# Patient Record
Sex: Male | Born: 1935 | Race: White | Hispanic: No | Marital: Married | Smoking: Former smoker
Health system: Southern US, Community
[De-identification: ages and names within clinical notes are randomized; demographics above are authoritative.]

## PROBLEM LIST (undated history)

## (undated) DIAGNOSIS — J069 Acute upper respiratory infection, unspecified: Secondary | ICD-10-CM

## (undated) DIAGNOSIS — K5792 Diverticulitis of intestine, part unspecified, without perforation or abscess without bleeding: Secondary | ICD-10-CM

## (undated) DIAGNOSIS — E079 Disorder of thyroid, unspecified: Secondary | ICD-10-CM

## (undated) DIAGNOSIS — K5730 Diverticulosis of large intestine without perforation or abscess without bleeding: Secondary | ICD-10-CM

## (undated) DIAGNOSIS — E039 Hypothyroidism, unspecified: Secondary | ICD-10-CM

## (undated) DIAGNOSIS — D3A098 Benign carcinoid tumors of other sites: Secondary | ICD-10-CM

## (undated) DIAGNOSIS — E349 Endocrine disorder, unspecified: Secondary | ICD-10-CM

## (undated) DIAGNOSIS — C801 Malignant (primary) neoplasm, unspecified: Secondary | ICD-10-CM

## (undated) DIAGNOSIS — N529 Male erectile dysfunction, unspecified: Secondary | ICD-10-CM

## (undated) DIAGNOSIS — I341 Nonrheumatic mitral (valve) prolapse: Secondary | ICD-10-CM

## (undated) DIAGNOSIS — R140 Abdominal distension (gaseous): Secondary | ICD-10-CM

## (undated) DIAGNOSIS — E669 Obesity, unspecified: Secondary | ICD-10-CM

## (undated) DIAGNOSIS — N401 Enlarged prostate with lower urinary tract symptoms: Secondary | ICD-10-CM

## (undated) DIAGNOSIS — Z5189 Encounter for other specified aftercare: Secondary | ICD-10-CM

## (undated) DIAGNOSIS — R011 Cardiac murmur, unspecified: Secondary | ICD-10-CM

## (undated) DIAGNOSIS — J189 Pneumonia, unspecified organism: Secondary | ICD-10-CM

## (undated) DIAGNOSIS — I2510 Atherosclerotic heart disease of native coronary artery without angina pectoris: Secondary | ICD-10-CM

## (undated) DIAGNOSIS — I499 Cardiac arrhythmia, unspecified: Secondary | ICD-10-CM

## (undated) DIAGNOSIS — Z8719 Personal history of other diseases of the digestive system: Secondary | ICD-10-CM

## (undated) DIAGNOSIS — I10 Essential (primary) hypertension: Secondary | ICD-10-CM

## (undated) HISTORY — PX: NO PAST SURGERIES: SHX2092

## (undated) HISTORY — DX: Abdominal distension (gaseous): R14.0

## (undated) HISTORY — PX: EYE SURGERY: SHX253

## (undated) HISTORY — DX: Diverticulosis of large intestine without perforation or abscess without bleeding: K57.30

## (undated) HISTORY — DX: Nonrheumatic mitral (valve) prolapse: I34.1

## (undated) HISTORY — DX: Essential (primary) hypertension: I10

## (undated) HISTORY — DX: Obesity, unspecified: E66.9

## (undated) HISTORY — PX: MOUTH SURGERY: SHX715

## (undated) HISTORY — DX: Male erectile dysfunction, unspecified: N52.9

## (undated) HISTORY — DX: Personal history of other diseases of the digestive system: Z87.19

## (undated) HISTORY — DX: Disorder of thyroid, unspecified: E07.9

## (undated) HISTORY — DX: Diverticulitis of intestine, part unspecified, without perforation or abscess without bleeding: K57.92

## (undated) HISTORY — PX: OTHER SURGICAL HISTORY: SHX169

## (undated) HISTORY — DX: Endocrine disorder, unspecified: E34.9

## (undated) HISTORY — DX: Atherosclerotic heart disease of native coronary artery without angina pectoris: I25.10

---

## 1990-11-23 DIAGNOSIS — Z5189 Encounter for other specified aftercare: Secondary | ICD-10-CM

## 1990-11-23 HISTORY — DX: Encounter for other specified aftercare: Z51.89

## 2010-02-21 DEATH — deceased

## 2011-12-02 ENCOUNTER — Other Ambulatory Visit: Payer: Self-pay | Admitting: Family Medicine

## 2011-12-02 DIAGNOSIS — N6314 Unspecified lump in the right breast, lower inner quadrant: Secondary | ICD-10-CM

## 2011-12-11 ENCOUNTER — Ambulatory Visit
Admission: RE | Admit: 2011-12-11 | Discharge: 2011-12-11 | Disposition: A | Discharge status: Home / Self Care | Payer: Medicare HMO | Source: Ambulatory Visit | Attending: Family Medicine | Admitting: Family Medicine

## 2011-12-11 ENCOUNTER — Other Ambulatory Visit: Payer: Self-pay | Admitting: Family Medicine

## 2011-12-11 DIAGNOSIS — N6314 Unspecified lump in the right breast, lower inner quadrant: Secondary | ICD-10-CM

## 2011-12-11 DIAGNOSIS — N6310 Unspecified lump in the right breast, unspecified quadrant: Secondary | ICD-10-CM

## 2011-12-22 ENCOUNTER — Other Ambulatory Visit: Payer: Self-pay | Admitting: Family Medicine

## 2011-12-22 ENCOUNTER — Ambulatory Visit
Admission: RE | Admit: 2011-12-22 | Discharge: 2011-12-22 | Disposition: A | Discharge status: Home / Self Care | Payer: Medicare HMO | Source: Ambulatory Visit | Attending: Family Medicine | Admitting: Family Medicine

## 2011-12-22 DIAGNOSIS — R109 Unspecified abdominal pain: Secondary | ICD-10-CM

## 2011-12-23 ENCOUNTER — Ambulatory Visit
Admission: RE | Admit: 2011-12-23 | Discharge: 2011-12-23 | Disposition: A | Discharge status: Home / Self Care | Payer: Medicare HMO | Source: Ambulatory Visit | Attending: Family Medicine | Admitting: Family Medicine

## 2011-12-23 MED ORDER — IOHEXOL 300 MG/ML  SOLN
100.0000 mL | Freq: Once | INTRAMUSCULAR | Status: AC | PRN
  Administered 2011-12-23: 100 mL via INTRAVENOUS

## 2011-12-31 ENCOUNTER — Encounter (INDEPENDENT_AMBULATORY_CARE_PROVIDER_SITE_OTHER): Payer: Self-pay | Admitting: General Surgery

## 2012-01-07 ENCOUNTER — Encounter: Payer: Self-pay | Admitting: Internal Medicine

## 2012-01-07 ENCOUNTER — Ambulatory Visit (INDEPENDENT_AMBULATORY_CARE_PROVIDER_SITE_OTHER): Payer: Medicare HMO | Admitting: General Surgery

## 2012-01-07 ENCOUNTER — Encounter (INDEPENDENT_AMBULATORY_CARE_PROVIDER_SITE_OTHER): Payer: Self-pay | Admitting: General Surgery

## 2012-01-07 VITALS — BP 136/74 | HR 70 | Temp 97.6°F | Resp 16 | Ht 69.0 in | Wt 170.4 lb

## 2012-01-07 DIAGNOSIS — I341 Nonrheumatic mitral (valve) prolapse: Secondary | ICD-10-CM | POA: Insufficient documentation

## 2012-01-07 DIAGNOSIS — R1903 Right lower quadrant abdominal swelling, mass and lump: Secondary | ICD-10-CM

## 2012-01-07 DIAGNOSIS — D3A00 Benign carcinoid tumor of unspecified site: Secondary | ICD-10-CM

## 2012-01-07 DIAGNOSIS — I2510 Atherosclerotic heart disease of native coronary artery without angina pectoris: Secondary | ICD-10-CM | POA: Insufficient documentation

## 2012-01-07 DIAGNOSIS — I10 Essential (primary) hypertension: Secondary | ICD-10-CM | POA: Insufficient documentation

## 2012-01-07 DIAGNOSIS — I059 Rheumatic mitral valve disease, unspecified: Secondary | ICD-10-CM

## 2012-01-07 NOTE — Patient Instructions (Signed)
You have calcified lymph nodes in your right lower abdomen. I suspect that this is causing your intermittent episodes of pain and vomiting. I do not know whether there is a malignancy or not.  He will be referred immediately for colonoscopy.  We will be obtaining urine and blood tests  You will have a nuclear medicine scan to see if there is a carcinoid tumor somewhere.  You will return to see me in 2 weeks, and probably we will need to schedule you for an abdominal operation. We discussed all this in detail today.

## 2012-01-07 NOTE — Progress Notes (Signed)
Patient ID: Matthew Brennan, male   DOB: [birth year], 78 y.o.   MRN: [id number]  Chief Complaint  Patient presents with  . New Evaluation    Recurrent small bowel obstruction    HPI Matthew Brennan is a 78 y.o. male.  He is referred to me by Dr. Juliene Pina for evaluation of abdominal pain, vomiting, and a calcified nodal mass in the right lower quadrant mesentery.  The patient used to live in [place] near [place] but was remarried and moved to [place] in August of 2012. In December 2011, while in [place] he was admitted to the hospital for 24 hours with abdominal pain. He thought he had a bowel obstruction. He was told he had diverticulitis. Was discharged home. There was no followup according to him.  Over the past year he states that he gets "blocked" which is manifested by abdominal pain nausea and vomiting. This lasts 24-48 hours. He gets distended. This resolves. This occurs about once every 3 months. His last colonoscopy was 6 years ago. He states that he has lost 10 pounds in the past 2 months. He does not have diarrhea or flushing or diaphoresis. He mostly has hard stools but does have regular stools. He's been eating more liquid, more softer foods lately.  He is asymptomatic today. He has not had any prior abdominal surgery. There've been no fevers.  Recent CT scan shows calcified mesenteric nodal mass measuring 3-1/2 cm in the right lower quadrant. Differential considerations include adenocarcinoma, carcinoid or old granulomatous disease. No small bowel tumor is identified. Also noted was a large, fat-containing right inguinal hernia. There were tiny pulmonary nodules the lung bases. Liver looked normal. HPI  Past Medical History  Diagnosis Date  . Hypertension   . Mitral valve prolapse   . Thyroid disease     hypothyroidism  . Obesity   . Testosterone deficiency   . ED (erectile dysfunction)   . Diverticulitis   . Hx SBO   . CAD (coronary artery disease)   . Abdominal  distension   . Abdominal pain     History reviewed. No pertinent past surgical history.  Family History  Problem Relation Age of Onset  . Kidney disease Father     Social History History  Substance Use Topics  . Smoking status: Former Smoker    Quit date: 01/06/1962  . Smokeless tobacco: Never Used  . Alcohol Use: No    Allergies  Allergen Reactions  . Ciprofloxacin Rash    All over the body  . Synthroid Rash    All over the body    Current Outpatient Prescriptions  Medication Sig Dispense Refill  . Ascorbic Acid (VITAMIN C) 1000 MG tablet Take 1,000 mg by mouth daily.      Marland Kitchen testosterone (ANDROGEL) 50 MG/5GM GEL Place 5 g onto the skin daily.      . valsartan-hydrochlorothiazide (DIOVAN-HCT) 320-25 MG per tablet Take 1 tablet by mouth daily.      . vitamin E 400 UNIT capsule Take 800 Units by mouth daily.        Review of Systems Review of Systems  Constitutional: Positive for unexpected weight change. Negative for fever and chills.  HENT: Negative for hearing loss, congestion, sore throat, trouble swallowing and voice change.   Eyes: Negative for visual disturbance.  Respiratory: Negative for cough and wheezing.   Cardiovascular: Negative for chest pain, palpitations and leg swelling.  Gastrointestinal: Positive for nausea, vomiting, abdominal pain and abdominal distention. Negative for  diarrhea, constipation, blood in stool, anal bleeding and rectal pain.  Genitourinary: Negative for hematuria and difficulty urinating.  Musculoskeletal: Negative for arthralgias.  Skin: Negative for rash and wound.  Neurological: Negative for seizures, syncope, weakness and headaches.  Hematological: Negative for adenopathy. Does not bruise/bleed easily.  Psychiatric/Behavioral: Negative for confusion.    Blood pressure 136/74, pulse 70, temperature 97.6 F (36.4 C), temperature source Temporal, resp. rate 16, height 5\' 9"  (1.753 m), weight 170 lb 6.4 oz (77.293 kg).  Physical  Exam Physical Exam  Constitutional: He is oriented to person, place, and time. He appears well-developed and well-nourished. No distress.  HENT:  Head: Normocephalic.  Nose: Nose normal.  Mouth/Throat: No oropharyngeal exudate.  Eyes: Conjunctivae and EOM are normal. Pupils are equal, round, and reactive to light. Right eye exhibits no discharge. Left eye exhibits no discharge. No scleral icterus.  Neck: Normal range of motion. Neck supple. No JVD present. No tracheal deviation present. No thyromegaly present.  Cardiovascular: Normal rate, regular rhythm and intact distal pulses.   Murmur heard.      Systolic murmur  Pulmonary/Chest: Effort normal and breath sounds normal. No stridor. No respiratory distress. He has no wheezes. He has no rales. He exhibits no tenderness.       8 mm subcutaneous lipoma right medial pectoral area.  Abdominal: Soft. Bowel sounds are normal. He exhibits no distension and no mass. There is no tenderness. There is no rebound and no guarding.       Abdomen is soft. Objectively not tender, but subjectively he does feel some pain in the right infraumbilical area. I do not feel a mass or hernia. Liver is not enlarged. Not distended.  Musculoskeletal: Normal range of motion. He exhibits no edema and no tenderness.  Lymphadenopathy:    He has no cervical adenopathy.  Neurological: He is alert and oriented to person, place, and time. He has normal reflexes. Coordination normal.  Skin: Skin is warm and dry. No rash noted. He is not diaphoretic. No erythema. No pallor.  Psychiatric: He has a normal mood and affect. His behavior is normal. Judgment and thought content normal.    Data Reviewed I reviewed the CT scan, and CT scan report, and the notes from [facility].  Assessment    Abdominal pain, intermittent vomiting, calcified nodal mass right lower quadrant. Etiology unclear. Agree the considerations are granulomatous disease, adenocarcinoma, carcinoid tumor.  Historically it sounds like he may have intermittent obstruction.  Hypertension  Mitral valve prolapse.      Plan     I suspect that he will need an abdominal operation to sort this out.  Since he is asymptomatic at this time, we're going to  pursue a preop workup. He will be referred to gastroenterology for GI consultation and colonoscopy. We will get a nuclear medicine octreotide scan. We'll get a 24-hour urine for HIAA and its we will get blood for CEA and chromogranin A.  Return to see me in 2 weeks after all this is done and we'll decide where to go with the surgical intervention       Angelia Mould. Derrell Lolling, M.D., [facility] General and Minimally invasive Surgery Breast and Colorectal Surgery Office:   [phone] Pager:   [phone]  01/07/2012, 10:26 AM

## 2012-01-08 ENCOUNTER — Telehealth: Payer: Self-pay | Admitting: *Deleted

## 2012-01-08 LAB — CEA: CEA: <0.5 ng/mL (ref 0.0–5.0)

## 2012-01-08 NOTE — Telephone Encounter (Signed)
Dr Rhea Belton:  Pt is scheduled for colonoscopy on 2/25 for tumor marking for carcinoid tumor of abdomen.  I do not see where you have seen this pt before.  Please see Office visit with Dr. Claud Kelp on 2/14.  Are you okay with this patient being direct colonoscopy or would you like to see him in office before procedure? Thanks, Ezra Sites

## 2012-01-11 ENCOUNTER — Ambulatory Visit (AMBULATORY_SURGERY_CENTER): Payer: Medicare HMO | Admitting: *Deleted

## 2012-01-11 ENCOUNTER — Encounter: Payer: Self-pay | Admitting: Internal Medicine

## 2012-01-11 VITALS — Ht 69.0 in | Wt 175.1 lb

## 2012-01-11 DIAGNOSIS — R1903 Right lower quadrant abdominal swelling, mass and lump: Secondary | ICD-10-CM

## 2012-01-11 DIAGNOSIS — D3A00 Benign carcinoid tumor of unspecified site: Secondary | ICD-10-CM

## 2012-01-11 MED ORDER — PEG-KCL-NACL-NASULF-NA ASC-C 100 G PO SOLR: ORAL | Status: DC

## 2012-01-11 NOTE — Telephone Encounter (Signed)
lmom for pt to call  °

## 2012-01-11 NOTE — Telephone Encounter (Signed)
Ezra Sites, RN in [facility] called back to report pt is coming in today. Since I can't get in touch with him, I gave the appt to [facility]; pt may change if he needs to.

## 2012-01-11 NOTE — Telephone Encounter (Signed)
Office visit 1st please.

## 2012-01-12 ENCOUNTER — Encounter: Payer: Self-pay | Admitting: Internal Medicine

## 2012-01-12 ENCOUNTER — Ambulatory Visit (HOSPITAL_COMMUNITY): Payer: Medicare HMO

## 2012-01-12 LAB — CHROMOGRANIN A: Chromogranin A: 54 ng/mL — ABNORMAL HIGH (ref 1.9–15.0)

## 2012-01-13 ENCOUNTER — Ambulatory Visit (INDEPENDENT_AMBULATORY_CARE_PROVIDER_SITE_OTHER): Payer: Medicare HMO | Admitting: Internal Medicine

## 2012-01-13 ENCOUNTER — Ambulatory Visit (HOSPITAL_COMMUNITY): Payer: Medicare HMO

## 2012-01-13 ENCOUNTER — Encounter: Payer: Self-pay | Admitting: Internal Medicine

## 2012-01-13 VITALS — BP 130/82 | HR 66 | Ht 69.0 in | Wt 170.0 lb

## 2012-01-13 DIAGNOSIS — R1031 Right lower quadrant pain: Secondary | ICD-10-CM

## 2012-01-13 DIAGNOSIS — Z8719 Personal history of other diseases of the digestive system: Secondary | ICD-10-CM

## 2012-01-13 DIAGNOSIS — R933 Abnormal findings on diagnostic imaging of other parts of digestive tract: Secondary | ICD-10-CM

## 2012-01-13 DIAGNOSIS — R1903 Right lower quadrant abdominal swelling, mass and lump: Secondary | ICD-10-CM

## 2012-01-13 NOTE — Progress Notes (Signed)
Subjective:    Patient ID: Matthew Brennan, male    DOB: 1932-04-17, 78 y.o.   MRN: [id number]  HPI Matthew Brennan is a 78 yo male with PMH of hypertension, mitral valve prolapse, hypothyroidism, CAD, and recurrent small bowel obstruction who is seen in consultation from Dr. Derrell Lolling for evaluation of right lower quadrant pain, abnormal GI tract imaging and history of bowel obstruction. The patient had a recent CT scan which showed a calcified nodal mass in the right lower quadrant mesentery. He recently moved from [place] to [place] in August 2012, but prior to moving he was treated for recurrent small bowel obstructions, once requiring hospitalization. There was no obvious etiology for a small bowel obstruction, and he was told it was even diverticulitis. He reports over the past several years having episodes of right lower corner pain along with bloating, which he associates with transient obstruction. During these periods he would reduce his diet to liquids and "take it easy", which usually was enough for these episodes to pass. He reports the last such episode was one month ago and again he reduced his diet to soft foods and liquids. The pain and distention resolved.  Currently he reports no pain, but tenderness in the right lower quadrant to palpation only. He does report a good appetite, but he is afraid it more than a soft and liquid diet. He does report an approximate 10-12 pound weight loss over the last month. His bowel movements have been regular, formed and brown, occurring once daily. He denies blood or melena.  No current nausea or vomiting. No bad heartburn. No dysphagia or odynophagia. No fevers or chills.  Review of Systems As per history of present illness, otherwise negative  Patient Active Problem List  Diagnoses  . Hypertension  . Mitral valve prolapse  . CAD (coronary artery disease)  . Abdominal mass, RLQ (right lower quadrant)   Past Surgical History  Procedure Date  . No  prior surgeries    Current Outpatient Prescriptions  Medication Sig Dispense Refill  . Ascorbic Acid (VITAMIN C) 1000 MG tablet Take 1,000 mg by mouth daily.      Marland Kitchen testosterone (ANDROGEL) 50 MG/5GM GEL Place 5 g onto the skin daily.      Marland Kitchen UNABLE TO FIND Med Name: ETDA.  Takes 18 drops daily      . valsartan-hydrochlorothiazide (DIOVAN-HCT) 320-25 MG per tablet Take 1 tablet by mouth daily.      Marland Kitchen VITAMIN D, ERGOCALCIFEROL, PO Take 800 Units by mouth daily.      . vitamin E 400 UNIT capsule Take 800 Units by mouth daily.      . peg 3350 powder (MOVIPREP) 100 G SOLR moviprep as directed  1 kit  0   Allergies  Allergen Reactions  . Ciprofloxacin Rash    All over the body  . Synthroid Rash    All over the body   Family History  Problem Relation Age of Onset  . Kidney disease Father   . Colon cancer Neg Hx   . Stomach cancer Neg Hx    History   Social History  . Marital Status: Married    Spouse Name: N/A    Number of Children: N/A  . Years of Education: N/A   Social History Main Topics  . Smoking status: Former Smoker    Quit date: 01/06/1962  . Smokeless tobacco: Never Used  . Alcohol Use: No  . Drug Use: No      Objective:  Physical Exam BP 130/82  Pulse 66  Ht 5\' 9"  (1.753 m)  Wt 170 lb (77.111 kg)  BMI 25.10 kg/m2 Constitutional: Well-developed and well-nourished. No distress. HEENT: Normocephalic and atraumatic. Oropharynx is clear and moist. No oropharyngeal exudate. Conjunctivae are normal. Pupils are equal round and reactive to light. No scleral icterus. Neck: Neck supple. Trachea midline. Cardiovascular: Normal rate, regular rhythm and intact distal pulses. 2/6 SEM Pulmonary/chest: Effort normal and breath sounds normal. No wheezing, rales or rhonchi. Abdominal: Soft, very mild tenderness in the right midabdomen lateral to the umbilicus without rebound or guarding, nondistended. Bowel sounds active throughout. There are no masses palpable. No  hepatosplenomegaly. Extremities: no clubbing, cyanosis, or edema Lymphadenopathy: No cervical adenopathy noted. Neurological: Alert and oriented to person place and time. Skin: Skin is warm and dry. No rashes noted. Psychiatric: Normal mood and affect. Behavior is normal.  Imaging reviewed CT ABDOMEN AND PELVIS WITH CONTRAST 12/22/11   Technique:  Multidetector CT imaging of the abdomen and pelvis was performed following the standard protocol during bolus administration of intravenous contrast.   Contrast: OMNIPAQUE IOHEXOL 300 MG/ML IV SOLN   BUN and creatinine were obtained on site at [facility] at [address] Results:  BUN 23 mg/dL,  Creatinine 1.1 mg/dL.   Comparison: Acute abdominal series 12/22/2011   Findings: Tiny sub 4 mm ground-glass attenuation pulmonary nodule is present adjacent to the left major fissure in the left lower lobe.  Scattered other smaller pulmonary nodules are identified. No further follow-up is recommended in a patient without history malignancy. Coronary artery atherosclerosis is present. If office based assessment of coronary risk factors has not been performed, it is now recommended.  Abdominal aortic atherosclerosis.  No aneurysm.  Vasectomy clips are present in the inguinal canals. There is a large fat containing right inguinal hernia.   The liver appears within normal limits.  Spleen normal. Gallbladder normal.  No calcified gallstones.  Common bile duct is within normal limits.  Pancreas normal.  Adrenal glands normal. Bilateral renal cysts are present which appears simple.  Stomach appears normal.  Duodenum is within normal limits.   There is no small bowel obstruction.  Distended loops of small bowel are present in the right lower quadrant with fluid levels. There is some tethering of small bowel loops and a cluster of mesenteric nodules in the left lower quadrant with central calcification.  In aggregate, these  measure 36 mm x 19 mm (image 44 series 2). Colon shows severe diverticulosis without diverticulitis.  Urinary bladder appears normal.  Prostatomegaly. Large calcification in the left inguinal canal probably is a phlebolith.   No aggressive osseous lesions.  L4-L5 predominant spondylosis. No convincing evidence of peroneal carcinomatosis.   IMPRESSION: 1.  Tethering of small bowel loops in the right lower quadrant with mild small bowel dilation.  No transition point or high-grade obstruction.  Calcified mesenteric nodal mass is present measuring 36 mm x 19 mm.  Differential considerations include adenocarcinoma with calcified nodal metastatic lesions, carcinoid tumor or old granulomatous disease.  No small bowel tumor is identified.   2.  Bilateral renal cysts. 3.  Liver appears within normal limits.  No hepatic metastatic disease. 4.  Large fat containing right inguinal hernia. 5.  Tiny pulmonary nodules at the lung bases.  Prior GI Hx --The patient reports several previous colonoscopies all performed in [place]. He feels the last one was approximately 6 years ago. He does not recall a history of polyps, but does  remember being told he has diverticulosis    Assessment & Plan:  78 yo male with PMH of hypertension, mitral valve prolapse, hypothyroidism, CAD, and recurrent small bowel obstruction who is seen in consultation from Dr. Derrell Lolling for evaluation of right lower quadrant pain, abnormal GI tract imaging and history of bowel obstruction.   1. Abnl imaging/RLQ pain/hx of SBO -- I am uncertain that this is related to an intraluminal GI issue, but agree that colonoscopy is very reasonable. This is already scheduled for Monday, and we have reviewed the risks and benefits of this test. He's undergone colonoscopy before, and had no questions and is agreeable to proceed. I will attempt to intubate the terminal ileum for complete inspection. The differential does include carcinoid, and Dr.  Derrell Lolling has already ordered a 24-hour urine for 5 HIAA, blood for CEA and chromogranin A.  I expect, he will need surgery to correct this problem, and he will see Dr. Derrell Lolling after the colonoscopy

## 2012-01-14 ENCOUNTER — Encounter (HOSPITAL_COMMUNITY): Payer: Medicare HMO

## 2012-01-14 LAB — 5 HIAA, QUANTITATIVE, URINE, 24 HOUR: 5-HIAA, 24 Hr Urine: 14.8 mg/24 h — ABNORMAL HIGH (ref <=6.0)

## 2012-01-18 ENCOUNTER — Encounter: Payer: Self-pay | Admitting: Internal Medicine

## 2012-01-18 ENCOUNTER — Ambulatory Visit (AMBULATORY_SURGERY_CENTER): Payer: Medicare HMO | Admitting: Internal Medicine

## 2012-01-18 DIAGNOSIS — R933 Abnormal findings on diagnostic imaging of other parts of digestive tract: Secondary | ICD-10-CM

## 2012-01-18 DIAGNOSIS — R1031 Right lower quadrant pain: Secondary | ICD-10-CM

## 2012-01-18 DIAGNOSIS — R1903 Right lower quadrant abdominal swelling, mass and lump: Secondary | ICD-10-CM

## 2012-01-18 MED ORDER — SODIUM CHLORIDE 0.9 % IV SOLN: 500.0000 mL | INTRAVENOUS | Status: DC

## 2012-01-18 NOTE — Progress Notes (Signed)
Patient did not experience any of the following events: a burn prior to discharge; a fall within the facility; wrong site/side/patient/procedure/implant event; or a hospital transfer or hospital admission upon discharge from the facility. (G8907) Patient did not have preoperative order for IV antibiotic SSI prophylaxis. (G8918)  

## 2012-01-18 NOTE — Patient Instructions (Signed)

## 2012-01-18 NOTE — Op Note (Signed)
[facility] [address] [place], [place]  [address]  COLONOSCOPY PROCEDURE REPORT  PATIENT:  Matthew Brennan, Matthew Brennan  MR#:  [id number] BIRTHDATE:  August 29, 1932, 79 yrs. old  GENDER:  male ENDOSCOPIST:  Carie Caddy. Blandina Renaldo, MD REF. BY:  Claud Kelp, M.D. PROCEDURE DATE:  01/18/2012 PROCEDURE:  Diagnostic Colonoscopy ASA CLASS:  Class II INDICATIONS:  Abnormal CT of abdomen, Abdominal pain MEDICATIONS:   These medications were titrated to patient response per physician's verbal order, Versed 7 mg IV, Fentanyl 75 mcg IV  DESCRIPTION OF PROCEDURE:   After the risks benefits and alternatives of the procedure were thoroughly explained, informed consent was obtained.  Digital rectal exam was performed and revealed no rectal masses.   The LB 180AL [id number] endoscope was introduced through the anus and advanced to the terminal ileum which was intubated for a short distance, without limitations. The quality of the prep was good, using MoviPrep.  The instrument was then slowly withdrawn as the colon was fully examined. <<PROCEDUREIMAGES>>  FINDINGS:  The terminal ileum was examined for approximate 15-20 cm and appeared normal.  Severe diverticulosis was found in the left colon.  Internal Hemorrhoids were found.   Retroflexed views in the rectum revealed no other findings other than those already described. The scope was then withdrawn from the cecum and the procedure completed.  COMPLICATIONS:  None ENDOSCOPIC IMPRESSION: 1) Normal terminal ileum 2) Severe diverticulosis in the left colon 3) Internal hemorrhoids  RECOMMENDATIONS: 1) Return to Dr. Derrell Lolling to discuss further surgical plan regarding RLQ mass and partial small bowel obstructions. 2) Assuming no prior history of colon polyps (awaiting records from prior colonoscopies), then repeat would be in 10 years based on current guidelines.  Carie Caddy. Rhea Belton, MD  CC:  Claud Kelp, MD Ancil Boozer MD The Patient  n. eSIGNEDCarie Caddy. Estefano Victory at 01/18/2012 10:39 AM  Brines, Greggory Stallion, [phone]

## 2012-01-19 ENCOUNTER — Telehealth: Payer: Self-pay | Admitting: *Deleted

## 2012-01-19 NOTE — Telephone Encounter (Signed)
Unavailable. "Call could not be completed as dialed"

## 2012-01-21 ENCOUNTER — Ambulatory Visit (INDEPENDENT_AMBULATORY_CARE_PROVIDER_SITE_OTHER): Payer: Medicare HMO | Admitting: General Surgery

## 2012-01-21 ENCOUNTER — Encounter (INDEPENDENT_AMBULATORY_CARE_PROVIDER_SITE_OTHER): Payer: Self-pay | Admitting: General Surgery

## 2012-01-21 VITALS — BP 160/78 | HR 70 | Temp 97.8°F | Resp 18 | Ht 69.0 in | Wt 171.2 lb

## 2012-01-21 DIAGNOSIS — D3A098 Benign carcinoid tumors of other sites: Secondary | ICD-10-CM

## 2012-01-21 NOTE — Patient Instructions (Signed)
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 This is consistent with the appearance of the calcifications on the CT scan.  You will be scheduled for an abdominal operation to remove the carcinoid tumor and whatever segments of your small intestine or large intestine that are involved with this. There is a small chance you might need a colostomy, but hopefully not.  You will be asked to do a modified bowel prep the day before surgery.

## 2012-01-21 NOTE — Progress Notes (Signed)
Patient ID: Matthew Brennan, male   DOB: 30-Mar-1932, 78 y.o.   MRN: [id number]  Chief Complaint  Patient presents with  . Results    HPI Matthew Brennan is a 78 y.o. male.   Matthew Brennan is a 78 y.o. male. He is referred to me by Dr. Juliene Pina for evaluation of abdominal pain, vomiting, and a calcified nodal mass in the right lower quadrant mesentery.  The patient used to live in [place] near [place] but was remarried and moved to [place] in August of 2012. In December 2011, while in [place] he was admitted to the hospital for 24 hours with abdominal pain. He thought he had a bowel obstruction. He was told he had diverticulitis. Was discharged home. There was no followup according to him.  Over the past year he states that he gets "blocked" which is manifested by abdominal pain nausea and vomiting. This lasts 24-48 hours. He gets distended. This resolves. This occurs about once every 3 months. His last colonoscopy was 6 years ago. He states that he has lost 10 pounds in the past 2 months. He does not have diarrhea or flushing or diaphoresis. He mostly has hard stools but does have regular stools. He's been eating more liquid, more softer foods lately.  He is asymptomatic today. He has not had any prior abdominal surgery. There've been no fevers.  Recent CT scan shows calcified mesenteric nodal mass measuring 3-1/2 cm in the right lower quadrant. Differential considerations include adenocarcinoma, carcinoid or old granulomatous disease. No small bowel tumor is identified. Also noted was a large, fat-containing right inguinal hernia. There were tiny pulmonary nodules the lung bases. Liver looked normal  Dr. Rhea Belton performed a colonoscopy which showed diverticula of the left colon but no neoplastic mass. CEA level is normal. Chromogranin A is elevated at 54. Urine HIAA is elevated at 14.8. We have been unable to do an octreotide scan.  He had another episode of abdominal distention and pain a  week ago. This resolved after 2 days of a liquid diet. He says that his bowel movements continued to function and he did not vomit. He is asymptomatic today.  We've had a long talk in the office today. 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 I have told him that he needs to have an abdominal operation to resect these areas. I told him that he probably will not need a colostomy but there is a small chance that may be necessary. He is ready to go ahead and plan this surgery.   HPI  Past Medical History  Diagnosis Date  . Hypertension   . Mitral valve prolapse   . Thyroid disease     hypothyroidism  . Obesity   . Testosterone deficiency   . ED (erectile dysfunction)   . Diverticulitis   . Hx SBO   . CAD (coronary artery disease)   . Abdominal distension   . Abdominal pain   . Diverticulosis of colon without diverticulitis     Past Surgical History  Procedure Date  . No prior surgeries     Family History  Problem Relation Age of Onset  . Kidney disease Father   . Colon cancer Neg Hx   . Stomach cancer Neg Hx     Social History History  Substance Use Topics  . Smoking status: Former Smoker    Quit date: 01/06/1962  . Smokeless tobacco:  Never Used  . Alcohol Use: No    Allergies  Allergen Reactions  . Ciprofloxacin Rash    All over the body  . Synthroid Rash    All over the body    Current Outpatient Prescriptions  Medication Sig Dispense Refill  . Ascorbic Acid (VITAMIN C) 1000 MG tablet Take 1,000 mg by mouth daily.      Marland Kitchen testosterone (ANDROGEL) 50 MG/5GM GEL Place 5 g onto the skin daily.      Marland Kitchen UNABLE TO FIND Med Name: ETDA.  Takes 18 drops daily      . valsartan-hydrochlorothiazide (DIOVAN-HCT) 320-25 MG per tablet Take 1 tablet by mouth daily.      Marland Kitchen VITAMIN D, ERGOCALCIFEROL, PO Take 800 Units by mouth daily.      . vitamin E 400 UNIT capsule Take 800 Units by mouth  daily.        Review of Systems Review of Systems  Constitutional: Negative for fever, chills and unexpected weight change.  HENT: Negative for hearing loss, congestion, sore throat, trouble swallowing and voice change.   Eyes: Negative for visual disturbance.  Respiratory: Positive for cough. Negative for wheezing.   Cardiovascular: Negative for chest pain, palpitations and leg swelling.  Gastrointestinal: Positive for abdominal pain and abdominal distention. Negative for nausea, vomiting, diarrhea, constipation, blood in stool, anal bleeding and rectal pain.  Genitourinary: Negative for hematuria and difficulty urinating.  Musculoskeletal: Negative for arthralgias.  Skin: Negative for rash and wound.  Neurological: Negative for seizures, syncope, weakness and headaches.  Hematological: Negative for adenopathy. Does not bruise/bleed easily.  Psychiatric/Behavioral: Negative for confusion.    Blood pressure 160/78, pulse 70, temperature 97.8 F (36.6 C), temperature source Temporal, resp. rate 18, height 5\' 9"  (1.753 m), weight 171 lb 3.2 oz (77.656 kg).  Physical Exam Physical Exam  Constitutional: He is oriented to person, place, and time. He appears well-developed and well-nourished. No distress.  HENT:  Head: Normocephalic.  Nose: Nose normal.  Mouth/Throat: No oropharyngeal exudate.  Eyes: Conjunctivae and EOM are normal. Pupils are equal, round, and reactive to light. Right eye exhibits no discharge. Left eye exhibits no discharge. No scleral icterus.  Neck: Normal range of motion. Neck supple. No JVD present. No tracheal deviation present. No thyromegaly present.  Cardiovascular: Normal rate, regular rhythm and intact distal pulses.   Murmur heard.      Systolic murmur heard.  Pulmonary/Chest: Effort normal and breath sounds normal. No stridor. No respiratory distress. He has no wheezes. He has no rales. He exhibits no tenderness.       Small lipoma right medial pectoral  area of chest wall.  Abdominal: Soft. Bowel sounds are normal. He exhibits no distension and no mass. There is no tenderness. There is no rebound and no guarding.       Not distended. Subjectively tender to the right of the umbilicus with minimal guarding.no mass  Musculoskeletal: Normal range of motion. He exhibits no edema and no tenderness.  Lymphadenopathy:    He has no cervical adenopathy.  Neurological: He is alert and oriented to person, place, and time. He has normal reflexes. Coordination normal.  Skin: Skin is warm and dry. No rash noted. He is not diaphoretic. No erythema. No pallor.  Psychiatric: He has a normal mood and affect. His behavior is normal. Judgment and thought content normal.    Data Reviewed I reviewed his colonoscopy report and photographs. I've reviewed the lab tests.  Assessment    Intermittent small  bowel obstruction. 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 He will need an abdominal operation to resolve his symptoms.  Hypertension  Mitral valve prolapse.    Plan    Schedule for exploratory laparotomy and resection of intestinal carcinoid tumor. This may involve the small intestine, large intestine, or both. Colostomy is possible, and felt to be less likely needed.  We will proceed with the octreotide scan if the tracer is shipped, otherwise we will proceed with surgery without it.  He will receive  a modified bowel prep one day preop  I have discussed the indications and details of surgery with the patient and his wife. Risks and complications have been outlined, including but not limited to bleeding, infection, colostomy, wound healing problems such as dehiscence or hernia, injury to adjacent organs such as the bladder, ureter or major vascular structures, recurrence of the carcinoid tumor, cardiac pulmonary and thromboembolic problems. He understands these issues. His questions are answered. He agrees with this plan.       Angelia Mould. Derrell Lolling,  M.D., [facility] General and Minimally invasive Surgery Breast and Colorectal Surgery Office:   [phone] Pager:   [phone]  01/21/2012, 12:53 PM

## 2012-01-25 ENCOUNTER — Encounter (HOSPITAL_COMMUNITY)
Admission: RE | Admit: 2012-01-25 | Discharge: 2012-01-25 | Disposition: A | Discharge status: Home / Self Care | Payer: Medicare HMO | Source: Ambulatory Visit | Attending: General Surgery | Admitting: General Surgery

## 2012-01-25 DIAGNOSIS — D3A00 Benign carcinoid tumor of unspecified site: Secondary | ICD-10-CM | POA: Insufficient documentation

## 2012-01-26 ENCOUNTER — Ambulatory Visit (HOSPITAL_COMMUNITY): Payer: Medicare HMO

## 2012-01-27 ENCOUNTER — Ambulatory Visit (HOSPITAL_COMMUNITY)
Admission: RE | Admit: 2012-01-27 | Discharge: 2012-01-27 | Disposition: A | Discharge status: Home / Self Care | Payer: Medicare HMO | Source: Ambulatory Visit | Attending: General Surgery | Admitting: General Surgery

## 2012-01-27 DIAGNOSIS — D3A098 Benign carcinoid tumors of other sites: Secondary | ICD-10-CM | POA: Insufficient documentation

## 2012-01-27 DIAGNOSIS — R1900 Intra-abdominal and pelvic swelling, mass and lump, unspecified site: Secondary | ICD-10-CM | POA: Insufficient documentation

## 2012-01-27 MED ORDER — INDIUM-111 PENTETATE
600.0000 | Freq: Once | INTRAVENOUS | Status: AC | PRN
  Administered 2012-01-26: 600 via INTRATHECAL

## 2012-02-08 NOTE — Pre-Procedure Instructions (Signed)
20 Matthew Brennan  02/08/2012   Your procedure is scheduled on:  Wednesday, March 27  Report to [facility] at 0800 AM.  Call this number if you have problems the morning of surgery: [phone]   Remember:   Do not eat food:After Midnight.  May have clear liquids: up to 4 Hours before arrival.  Clear liquids include soda, tea, black coffee, apple or grape juice, broth.  Take these medicines the morning of surgery with A SIP OF WATER: *None**   Do not wear jewelry, make-up or nail polish.  Do not wear lotions, powders, or perfumes. You may wear deodorant.  Do not shave 48 hours prior to surgery.  Do not bring valuables to the hospital.  Contacts, dentures or bridgework may not be worn into surgery.  Leave suitcase in the car. After surgery it may be brought to your room.  For patients admitted to the hospital, checkout time is 11:00 AM the day of discharge.   Patients discharged the day of surgery will not be allowed to drive home.     Special Instructions: CHG Shower Use Special Wash: 1/2 bottle night before surgery and 1/2 bottle morning of surgery.   Please read over the following fact sheets that you were given: Pain Booklet, Coughing and Deep Breathing, MRSA Information and Surgical Site Infection Prevention

## 2012-02-09 ENCOUNTER — Encounter (HOSPITAL_COMMUNITY): Payer: Self-pay

## 2012-02-09 ENCOUNTER — Encounter (HOSPITAL_COMMUNITY)
Admission: RE | Admit: 2012-02-09 | Discharge: 2012-02-09 | Disposition: A | Discharge status: Home / Self Care | Payer: Medicare HMO | Source: Ambulatory Visit | Attending: General Surgery | Admitting: General Surgery

## 2012-02-09 ENCOUNTER — Ambulatory Visit (HOSPITAL_COMMUNITY)
Admission: RE | Admit: 2012-02-09 | Discharge: 2012-02-09 | Disposition: A | Discharge status: Home / Self Care | Payer: Medicare HMO | Source: Ambulatory Visit | Attending: General Surgery | Admitting: General Surgery

## 2012-02-09 ENCOUNTER — Encounter (HOSPITAL_COMMUNITY): Payer: Self-pay | Admitting: Pharmacy Technician

## 2012-02-09 ENCOUNTER — Other Ambulatory Visit: Payer: Self-pay

## 2012-02-09 DIAGNOSIS — I10 Essential (primary) hypertension: Secondary | ICD-10-CM | POA: Insufficient documentation

## 2012-02-09 DIAGNOSIS — Z01818 Encounter for other preprocedural examination: Secondary | ICD-10-CM | POA: Insufficient documentation

## 2012-02-09 HISTORY — DX: Hypothyroidism, unspecified: E03.9

## 2012-02-09 HISTORY — DX: Encounter for other specified aftercare: Z51.89

## 2012-02-09 HISTORY — DX: Benign carcinoid tumors of other sites: D3A.098

## 2012-02-09 HISTORY — DX: Pneumonia, unspecified organism: J18.9

## 2012-02-09 HISTORY — DX: Cardiac arrhythmia, unspecified: I49.9

## 2012-02-09 HISTORY — DX: Benign prostatic hyperplasia with lower urinary tract symptoms: N40.1

## 2012-02-09 HISTORY — DX: Cardiac murmur, unspecified: R01.1

## 2012-02-09 LAB — URINALYSIS, ROUTINE W REFLEX MICROSCOPIC
Bilirubin Urine: NEGATIVE
Glucose, UA: NEGATIVE mg/dL
Hgb urine dipstick: NEGATIVE
Ketones, ur: NEGATIVE mg/dL
Leukocytes, UA: NEGATIVE
Nitrite: NEGATIVE
Protein, ur: NEGATIVE mg/dL
Specific Gravity, Urine: 1.015 (ref 1.005–1.030)
Urobilinogen, UA: 0.2 mg/dL (ref 0.0–1.0)
pH: 5 (ref 5.0–8.0)

## 2012-02-09 LAB — COMPREHENSIVE METABOLIC PANEL
ALT: 21 U/L (ref 0–53)
AST: 20 U/L (ref 0–37)
Albumin: 3.4 g/dL — ABNORMAL LOW (ref 3.5–5.2)
Alkaline Phosphatase: 120 U/L — ABNORMAL HIGH (ref 39–117)
BUN: 25 mg/dL — ABNORMAL HIGH (ref 6–23)
CO2: 26 mEq/L (ref 19–32)
Calcium: 8.9 mg/dL (ref 8.4–10.5)
Chloride: 102 mEq/L (ref 96–112)
Creatinine, Ser: 1.57 mg/dL — ABNORMAL HIGH (ref 0.50–1.35)
GFR calc Af Amer: 47 mL/min — ABNORMAL LOW (ref >90)
GFR calc non Af Amer: 41 mL/min — ABNORMAL LOW (ref >90)
Glucose, Bld: 98 mg/dL (ref 70–99)
Potassium: 4.1 mEq/L (ref 3.5–5.1)
Sodium: 138 mEq/L (ref 135–145)
Total Bilirubin: 0.4 mg/dL (ref 0.3–1.2)
Total Protein: 6.5 g/dL (ref 6.0–8.3)

## 2012-02-09 LAB — CBC
HCT: 38.4 % — ABNORMAL LOW (ref 39.0–52.0)
Hemoglobin: 13.3 g/dL (ref 13.0–17.0)
MCH: 30.5 pg (ref 26.0–34.0)
MCHC: 34.6 g/dL (ref 30.0–36.0)
MCV: 88.1 fL (ref 78.0–100.0)
Platelets: 200 10*3/uL (ref 150–400)
RBC: 4.36 MIL/uL (ref 4.22–5.81)
RDW: 13.6 % (ref 11.5–15.5)
WBC: 7.5 10*3/uL (ref 4.0–10.5)

## 2012-02-09 LAB — SURGICAL PCR SCREEN
MRSA, PCR: NEGATIVE
Staphylococcus aureus: NEGATIVE

## 2012-02-16 MED ORDER — CHLORHEXIDINE GLUCONATE 4 % EX LIQD: 1.0000 "application " | Freq: Once | CUTANEOUS | Status: DC

## 2012-02-16 MED ORDER — HEPARIN SODIUM (PORCINE) 5000 UNIT/ML IJ SOLN
5000.0000 [IU] | Freq: Once | INTRAMUSCULAR | Status: AC
  Administered 2012-02-17: 5000 [IU] via SUBCUTANEOUS
  Filled 2012-02-16: qty 1

## 2012-02-16 MED ORDER — SODIUM CHLORIDE 0.9 % IV SOLN
1.0000 g | INTRAVENOUS | Status: AC
  Administered 2012-02-17: 1000 mg via INTRAVENOUS
  Filled 2012-02-16: qty 1

## 2012-02-16 NOTE — H&P (Signed)
Matthew Brennan     MRN: [id number]   Description: 78 year old male  Provider: Ernestene Mention, MD   Department: [facility]      Diagnoses     Carcinoid tumor of abdomen   - Primary    209.69     Vitals      BP Pulse Temp(Src) Resp Ht Wt    160/78  70  97.8 F (36.6 C) (Temporal)  18  5\' 9"  (1.753 m)  171 lb 3.2 oz (77.656 kg)    BMI - 25.28 kg/m2                 History and Physical    Ernestene Mention, MD   Patient ID: Matthew Brennan, male   DOB: 17-Sep-1932, 78 y.o.   MRN: [id number]        HPI    Matthew Brennan is a 78 y.o. male. He is referred to me by Dr. Juliene Pina for evaluation of abdominal pain, vomiting, and a calcified nodal mass in the right lower quadrant mesentery.   The patient used to live in [place] near [place] but was remarried and moved to [place] in August of 2012. In December 2011, while in [place] he was admitted to the hospital for 24 hours with abdominal pain. He thought he had a bowel obstruction. He was told he had diverticulitis. Was discharged home. There was no followup according to him.   Over the past year he states that he gets "blocked" which is manifested by abdominal pain nausea and vomiting. This lasts 24-48 hours. He gets distended. This resolves. This occurs about once every 3 months. His last colonoscopy was 6 years ago. He states that he has lost 10 pounds in the past 2 months. He does not have diarrhea or flushing or diaphoresis. He mostly has hard stools but does have regular stools. He's been eating more liquid, more softer foods lately.   He is asymptomatic today. He has not had any prior abdominal surgery. There've been no fevers.   Recent CT scan shows calcified mesenteric nodal mass measuring 3-1/2 cm in the right lower quadrant. Differential considerations include adenocarcinoma, carcinoid or old granulomatous disease. No small bowel tumor is identified. Also noted was a large, fat-containing right inguinal  hernia. There were tiny pulmonary nodules the lung bases. Liver looked normal  Dr. Rhea Belton performed a colonoscopy which showed diverticula of the left colon but no neoplastic mass. CEA level is normal. Chromogranin A is elevated at 54. Urine HIAA is elevated at 14.8. Octreotide scan 01/27/2012 shows abnormal uptake right mid abdomen consistent with the calcified mass. No hepatic mets and no pulmonary mets seen.  He had another episode of abdominal distention and pain a week ago. This resolved after 2 days of a liquid diet. He says that his bowel movements continued to function and he did not vomit. He is asymptomatic today.  We've had a long talk in the office today. 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 I have told him that he needs to have an abdominal operation to resect these areas. I told him that he probably will not need a colostomy but there is a small chance that may be necessary. He is ready to go ahead and plan this surgery.     Past Medical History   Diagnosis  Date   .  Hypertension     .  Mitral valve prolapse     .  Thyroid disease         hypothyroidism   .  Obesity     .  Testosterone deficiency     .  ED (erectile dysfunction)     .  Diverticulitis     .  Hx SBO     .  CAD (coronary artery disease)     .  Abdominal distension     .  Abdominal pain     .  Diverticulosis of colon without diverticulitis         Past Surgical History   Procedure  Date   .  No prior surgeries         Family History   Problem  Relation  Age of Onset   .  Kidney disease  Father     .  Colon cancer  Neg Hx     .  Stomach cancer  Neg Hx       Social History History   Substance Use Topics   .  Smoking status:  Former Smoker       Quit date:  01/06/1962   .  Smokeless tobacco:  Never Used   .  Alcohol Use:  No       Allergies   Allergen  Reactions   .  Ciprofloxacin  Rash       All over  the body   .  Synthroid  Rash       All over the body       Current Outpatient Prescriptions   Medication  Sig  Dispense  Refill   .  Ascorbic Acid (VITAMIN C) 1000 MG tablet  Take 1,000 mg by mouth daily.         Marland Kitchen  testosterone (ANDROGEL) 50 MG/5GM GEL  Place 5 g onto the skin daily.         Marland Kitchen  UNABLE TO FIND  Med Name: ETDA.  Takes 18 drops daily         .  valsartan-hydrochlorothiazide (DIOVAN-HCT) 320-25 MG per tablet  Take 1 tablet by mouth daily.         Marland Kitchen  VITAMIN D, ERGOCALCIFEROL, PO  Take 800 Units by mouth daily.         .  vitamin E 400 UNIT capsule  Take 800 Units by mouth daily.            Review of Systems Review of Systems  Constitutional: Negative for fever, chills and unexpected weight change.  HENT: Negative for hearing loss, congestion, sore throat, trouble swallowing and voice change.   Eyes: Negative for visual disturbance.  Respiratory: Positive for cough. Negative for wheezing.   Cardiovascular: Negative for chest pain, palpitations and leg swelling.  Gastrointestinal: Positive for abdominal pain and abdominal distention. Negative for nausea, vomiting, diarrhea, constipation, blood in stool, anal bleeding and rectal pain.  Genitourinary: Negative for hematuria and difficulty urinating.  Musculoskeletal: Negative for arthralgias.  Skin: Negative for rash and wound.  Neurological: Negative for seizures, syncope, weakness and headaches.  Hematological: Negative for adenopathy. Does not bruise/bleed easily.  Psychiatric/Behavioral: Negative for confusion.    Blood pressure 160/78, pulse 70, temperature 97.8 F (36.6 C), temperature source Temporal, resp. rate 18, height 5\' 9"  (1.753 m), weight 171 lb 3.2 oz (77.656 kg).   Physical Exam  Constitutional: He is oriented to person, place, and time. He appears well-developed and well-nourished. No distress.  HENT:   Head:  Normocephalic.   Nose: Nose normal.   Mouth/Throat: No oropharyngeal exudate.  Eyes:  Conjunctivae and EOM are normal. Pupils are equal, round, and reactive to light. Right eye exhibits no discharge. Left eye exhibits no discharge. No scleral icterus.  Neck: Normal range of motion. Neck supple. No JVD present. No tracheal deviation present. No thyromegaly present.  Cardiovascular: Normal rate, regular rhythm and intact distal pulses.    Murmur heard.      Systolic murmur heard.  Pulmonary/Chest: Effort normal and breath sounds normal. No stridor. No respiratory distress. He has no wheezes. He has no rales. He exhibits no tenderness.       Small lipoma right medial pectoral area of chest wall.  Abdominal: Soft. Bowel sounds are normal. He exhibits no distension and no mass. There is no tenderness. There is no rebound and no guarding.       Not distended. Subjectively tender to the right of the umbilicus with minimal guarding.no mass  Musculoskeletal: Normal range of motion. He exhibits no edema and no tenderness.  Lymphadenopathy:    He has no cervical adenopathy.  Neurological: He is alert and oriented to person, place, and time. He has normal reflexes. Coordination normal.  Skin: Skin is warm and dry. No rash noted. He is not diaphoretic. No erythema. No pallor.  Psychiatric: He has a normal mood and affect. His behavior is normal. Judgment and thought content normal.    Data Reviewed I reviewed his colonoscopy report and photographs. I've reviewed the lab tests.   Assessment   Intermittent small bowel obstruction. Carcinoid(neoroendocrine) tumor is suspected as the etiology. He will need an abdominal operation to resolve his obstructive symptoms.  Hypertension  Mitral valve prolapse.   Plan   Schedule for exploratory laparotomy and resection of intestinal carcinoid tumor. This may involve the small intestine, large intestine, or both. Colostomy is possible, and felt to be less likely needed.  We will proceed with the octreotide scan if the tracer is shipped,  otherwise we will proceed with surgery without it.   He will receive  a modified bowel prep one day preop  I have discussed the indications and details of surgery with the patient and his wife. Risks and complications have been outlined, including but not limited to bleeding, infection, colostomy, wound healing problems such as dehiscence or hernia, injury to adjacent organs such as the bladder, ureter or major vascular structures, recurrence of the carcinoid tumor, cardiac pulmonary and thromboembolic problems. He understands these issues. His questions are answered. He agrees with this plan.       Angelia Mould. Derrell Lolling, M.D., [facility] General and Minimally invasive Surgery Breast and Colorectal Surgery Office:   [phone] Pager:   [phone]

## 2012-02-17 ENCOUNTER — Encounter (HOSPITAL_COMMUNITY)
Admission: RE | Disposition: A | Discharge status: Home / Self Care | Payer: Self-pay | Source: Ambulatory Visit | Attending: General Surgery

## 2012-02-17 ENCOUNTER — Encounter (HOSPITAL_COMMUNITY): Payer: Self-pay | Admitting: Anesthesiology

## 2012-02-17 ENCOUNTER — Inpatient Hospital Stay (HOSPITAL_COMMUNITY)
Admission: RE | Admit: 2012-02-17 | Discharge: 2012-02-22 | DRG: 330 | Disposition: A | Discharge status: Home / Self Care | Payer: Medicare HMO | Source: Ambulatory Visit | Attending: General Surgery | Admitting: General Surgery

## 2012-02-17 ENCOUNTER — Ambulatory Visit (HOSPITAL_COMMUNITY): Payer: Medicare HMO | Admitting: Anesthesiology

## 2012-02-17 ENCOUNTER — Encounter (HOSPITAL_COMMUNITY): Payer: Self-pay | Admitting: General Practice

## 2012-02-17 DIAGNOSIS — Z01812 Encounter for preprocedural laboratory examination: Secondary | ICD-10-CM

## 2012-02-17 DIAGNOSIS — K56609 Unspecified intestinal obstruction, unspecified as to partial versus complete obstruction: Secondary | ICD-10-CM | POA: Diagnosis present

## 2012-02-17 DIAGNOSIS — Z888 Allergy status to other drugs, medicaments and biological substances status: Secondary | ICD-10-CM

## 2012-02-17 DIAGNOSIS — Z886 Allergy status to analgesic agent status: Secondary | ICD-10-CM

## 2012-02-17 DIAGNOSIS — D1330 Benign neoplasm of unspecified part of small intestine: Secondary | ICD-10-CM

## 2012-02-17 DIAGNOSIS — C171 Malignant neoplasm of jejunum: Principal | ICD-10-CM | POA: Diagnosis present

## 2012-02-17 DIAGNOSIS — I059 Rheumatic mitral valve disease, unspecified: Secondary | ICD-10-CM | POA: Diagnosis present

## 2012-02-17 DIAGNOSIS — C801 Malignant (primary) neoplasm, unspecified: Secondary | ICD-10-CM

## 2012-02-17 DIAGNOSIS — I10 Essential (primary) hypertension: Secondary | ICD-10-CM | POA: Diagnosis present

## 2012-02-17 DIAGNOSIS — K639 Disease of intestine, unspecified: Secondary | ICD-10-CM | POA: Diagnosis present

## 2012-02-17 DIAGNOSIS — C772 Secondary and unspecified malignant neoplasm of intra-abdominal lymph nodes: Secondary | ICD-10-CM | POA: Diagnosis present

## 2012-02-17 DIAGNOSIS — C7A019 Malignant carcinoid tumor of the small intestine, unspecified portion: Secondary | ICD-10-CM

## 2012-02-17 DIAGNOSIS — Z841 Family history of disorders of kidney and ureter: Secondary | ICD-10-CM

## 2012-02-17 HISTORY — PX: LAPAROTOMY: SHX154

## 2012-02-17 HISTORY — PX: BOWEL RESECTION: SHX1257

## 2012-02-17 HISTORY — DX: Malignant (primary) neoplasm, unspecified: C80.1

## 2012-02-17 HISTORY — PX: OTHER SURGICAL HISTORY: SHX169

## 2012-02-17 HISTORY — DX: Acute upper respiratory infection, unspecified: J06.9

## 2012-02-17 SURGERY — LAPAROTOMY, EXPLORATORY
Anesthesia: General | Site: Abdomen | Wound class: Clean Contaminated

## 2012-02-17 MED ORDER — NALOXONE HCL 0.4 MG/ML IJ SOLN: 0.4000 mg | INTRAMUSCULAR | Status: DC | PRN

## 2012-02-17 MED ORDER — ONDANSETRON HCL 4 MG/2ML IJ SOLN: 4.0000 mg | Freq: Once | INTRAMUSCULAR | Status: DC | PRN

## 2012-02-17 MED ORDER — HYDROMORPHONE HCL PF 1 MG/ML IJ SOLN: 0.2500 mg | INTRAMUSCULAR | Status: DC | PRN

## 2012-02-17 MED ORDER — 0.9 % SODIUM CHLORIDE (POUR BTL) OPTIME
TOPICAL | Status: DC | PRN
  Administered 2012-02-17: 2000 mL
  Administered 2012-02-17: 1000 mL
  Administered 2012-02-17: 2000 mL

## 2012-02-17 MED ORDER — SODIUM CHLORIDE 0.9 % IJ SOLN: 9.0000 mL | INTRAMUSCULAR | Status: DC | PRN

## 2012-02-17 MED ORDER — ROCURONIUM BROMIDE 100 MG/10ML IV SOLN
INTRAVENOUS | Status: DC | PRN
  Administered 2012-02-17: 50 mg via INTRAVENOUS

## 2012-02-17 MED ORDER — ONDANSETRON HCL 4 MG/2ML IJ SOLN
INTRAMUSCULAR | Status: DC | PRN
  Administered 2012-02-17: 4 mg via INTRAVENOUS

## 2012-02-17 MED ORDER — MORPHINE SULFATE (PF) 1 MG/ML IV SOLN
INTRAVENOUS | Status: AC
  Administered 2012-02-17: 10.5 mg via INTRAVENOUS
  Filled 2012-02-17: qty 25

## 2012-02-17 MED ORDER — TESTOSTERONE 50 MG/5GM (1%) TD GEL
5.0000 g | Freq: Every day | TRANSDERMAL | Status: DC
  Administered 2012-02-18 – 2012-02-21 (×4): 5 g via TRANSDERMAL
  Filled 2012-02-17 (×5): qty 5

## 2012-02-17 MED ORDER — GLYCOPYRROLATE 0.2 MG/ML IJ SOLN
INTRAMUSCULAR | Status: DC | PRN
  Administered 2012-02-17: 0.6 mg via INTRAVENOUS

## 2012-02-17 MED ORDER — HYDROMORPHONE HCL PF 1 MG/ML IJ SOLN
0.2500 mg | INTRAMUSCULAR | Status: DC | PRN
  Administered 2012-02-17 (×4): 0.5 mg via INTRAVENOUS

## 2012-02-17 MED ORDER — ONDANSETRON HCL 4 MG/2ML IJ SOLN: 4.0000 mg | Freq: Four times a day (QID) | INTRAMUSCULAR | Status: DC | PRN

## 2012-02-17 MED ORDER — LIDOCAINE HCL 1 % IJ SOLN
INTRAMUSCULAR | Status: DC | PRN
  Administered 2012-02-17: 100 mg via INTRADERMAL

## 2012-02-17 MED ORDER — PHENYLEPHRINE HCL 10 MG/ML IJ SOLN
INTRAMUSCULAR | Status: DC | PRN
  Administered 2012-02-17 (×4): 80 ug via INTRAVENOUS

## 2012-02-17 MED ORDER — LACTATED RINGERS IV SOLN
INTRAVENOUS | Status: DC | PRN
  Administered 2012-02-17 (×2): via INTRAVENOUS

## 2012-02-17 MED ORDER — MIDAZOLAM HCL 5 MG/5ML IJ SOLN
INTRAMUSCULAR | Status: DC | PRN
  Administered 2012-02-17: 2 mg via INTRAVENOUS

## 2012-02-17 MED ORDER — CHLORHEXIDINE GLUCONATE 0.12 % MT SOLN
15.0000 mL | Freq: Two times a day (BID) | OROMUCOSAL | Status: DC
  Administered 2012-02-18 – 2012-02-21 (×8): 15 mL via OROMUCOSAL
  Filled 2012-02-17 (×9): qty 15

## 2012-02-17 MED ORDER — ONDANSETRON HCL 4 MG/2ML IJ SOLN
4.0000 mg | Freq: Four times a day (QID) | INTRAMUSCULAR | Status: DC | PRN
  Administered 2012-02-17: 4 mg via INTRAVENOUS

## 2012-02-17 MED ORDER — DIPHENHYDRAMINE HCL 50 MG/ML IJ SOLN: 12.5000 mg | Freq: Four times a day (QID) | INTRAMUSCULAR | Status: DC | PRN

## 2012-02-17 MED ORDER — MORPHINE SULFATE (PF) 1 MG/ML IV SOLN
INTRAVENOUS | Status: AC
  Filled 2012-02-17: qty 25

## 2012-02-17 MED ORDER — SODIUM CHLORIDE 0.9 % IV SOLN
1.0000 g | INTRAVENOUS | Status: AC
  Administered 2012-02-18: 1 g via INTRAVENOUS
  Filled 2012-02-17 (×2): qty 1

## 2012-02-17 MED ORDER — LACTATED RINGERS IV SOLN
INTRAVENOUS | Status: DC
  Administered 2012-02-17: 10:00:00 via INTRAVENOUS

## 2012-02-17 MED ORDER — ONDANSETRON HCL 4 MG/2ML IJ SOLN
INTRAMUSCULAR | Status: AC
  Filled 2012-02-17: qty 2

## 2012-02-17 MED ORDER — MORPHINE SULFATE (PF) 1 MG/ML IV SOLN
INTRAVENOUS | Status: DC
  Administered 2012-02-17: 14:00:00 via INTRAVENOUS
  Administered 2012-02-17: 20.7 mg via INTRAVENOUS
  Administered 2012-02-17: 6 mg via INTRAVENOUS
  Administered 2012-02-18: 10.5 mg via INTRAVENOUS
  Administered 2012-02-18: 6 mg via INTRAVENOUS
  Administered 2012-02-18: 10.7 mg via INTRAVENOUS

## 2012-02-17 MED ORDER — MORPHINE SULFATE 2 MG/ML IJ SOLN: 0.0500 mg/kg | INTRAMUSCULAR | Status: DC | PRN

## 2012-02-17 MED ORDER — POTASSIUM CHLORIDE IN NACL 20-0.9 MEQ/L-% IV SOLN
INTRAVENOUS | Status: DC
  Administered 2012-02-17 – 2012-02-22 (×10): via INTRAVENOUS
  Filled 2012-02-17 (×19): qty 1000

## 2012-02-17 MED ORDER — LACTATED RINGERS IV SOLN
INTRAVENOUS | Status: DC | PRN
  Administered 2012-02-17 (×2): via INTRAVENOUS

## 2012-02-17 MED ORDER — HYDROMORPHONE HCL PF 1 MG/ML IJ SOLN
INTRAMUSCULAR | Status: AC
  Filled 2012-02-17: qty 1

## 2012-02-17 MED ORDER — ONDANSETRON HCL 4 MG PO TABS: 4.0000 mg | ORAL_TABLET | Freq: Four times a day (QID) | ORAL | Status: DC | PRN

## 2012-02-17 MED ORDER — METOPROLOL TARTRATE 1 MG/ML IV SOLN
5.0000 mg | Freq: Four times a day (QID) | INTRAVENOUS | Status: DC
  Administered 2012-02-17 – 2012-02-21 (×12): 5 mg via INTRAVENOUS
  Filled 2012-02-17 (×20): qty 5

## 2012-02-17 MED ORDER — BIOTENE DRY MOUTH MT LIQD
15.0000 mL | Freq: Two times a day (BID) | OROMUCOSAL | Status: DC
  Administered 2012-02-18 – 2012-02-21 (×8): 15 mL via OROMUCOSAL

## 2012-02-17 MED ORDER — HEPARIN SODIUM (PORCINE) 5000 UNIT/ML IJ SOLN
5000.0000 [IU] | Freq: Three times a day (TID) | INTRAMUSCULAR | Status: DC
  Administered 2012-02-18 – 2012-02-22 (×12): 5000 [IU] via SUBCUTANEOUS
  Filled 2012-02-17 (×15): qty 1

## 2012-02-17 MED ORDER — NEOSTIGMINE METHYLSULFATE 1 MG/ML IJ SOLN
INTRAMUSCULAR | Status: DC | PRN
  Administered 2012-02-17: 4.5 mg via INTRAVENOUS

## 2012-02-17 MED ORDER — DIPHENHYDRAMINE HCL 12.5 MG/5ML PO ELIX
12.5000 mg | ORAL_SOLUTION | Freq: Four times a day (QID) | ORAL | Status: DC | PRN
  Filled 2012-02-17: qty 5

## 2012-02-17 MED ORDER — FENTANYL CITRATE 0.05 MG/ML IJ SOLN
INTRAMUSCULAR | Status: DC | PRN
  Administered 2012-02-17 (×2): 50 ug via INTRAVENOUS
  Administered 2012-02-17: 100 ug via INTRAVENOUS
  Administered 2012-02-17: 50 ug via INTRAVENOUS

## 2012-02-17 MED ORDER — PROPOFOL 10 MG/ML IV EMUL
INTRAVENOUS | Status: DC | PRN
  Administered 2012-02-17: 200 mg via INTRAVENOUS

## 2012-02-17 SURGICAL SUPPLY — 44 items
BLADE SURG ROTATE 9660 (MISCELLANEOUS) ×3 IMPLANT
CANISTER SUCTION 2500CC (MISCELLANEOUS) ×6 IMPLANT
CHLORAPREP W/TINT 26ML (MISCELLANEOUS) ×3 IMPLANT
CLOTH BEACON ORANGE TIMEOUT ST (SAFETY) ×3 IMPLANT
COVER SURGICAL LIGHT HANDLE (MISCELLANEOUS) ×3 IMPLANT
DRAPE LAPAROSCOPIC ABDOMINAL (DRAPES) ×3 IMPLANT
DRAPE WARM FLUID 44X44 (DRAPE) ×3 IMPLANT
ELECT CAUTERY BLADE 6.4 (BLADE) ×3 IMPLANT
ELECT REM PT RETURN 9FT ADLT (ELECTROSURGICAL) ×3
ELECTRODE REM PT RTRN 9FT ADLT (ELECTROSURGICAL) ×2 IMPLANT
GLOVE BIO SURGEON STRL SZ7 (GLOVE) ×3 IMPLANT
GLOVE BIOGEL PI IND STRL 7.0 (GLOVE) ×6 IMPLANT
GLOVE BIOGEL PI INDICATOR 7.0 (GLOVE) ×3
GLOVE EUDERMIC 7 POWDERFREE (GLOVE) ×9 IMPLANT
GLOVE SS BIOGEL STRL SZ 6.5 (GLOVE) ×4 IMPLANT
GLOVE SUPERSENSE BIOGEL SZ 6.5 (GLOVE) ×2
GLOVE SURG SIGNA 7.5 PF LTX (GLOVE) ×6 IMPLANT
GOWN PREVENTION PLUS XLARGE (GOWN DISPOSABLE) ×9 IMPLANT
GOWN STRL NON-REIN LRG LVL3 (GOWN DISPOSABLE) ×6 IMPLANT
KIT BASIN OR (CUSTOM PROCEDURE TRAY) ×3 IMPLANT
KIT ROOM TURNOVER OR (KITS) ×3 IMPLANT
LIGASURE IMPACT 36 18CM CVD LR (INSTRUMENTS) ×3 IMPLANT
NS IRRIG 1000ML POUR BTL (IV SOLUTION) ×15 IMPLANT
PACK GENERAL/GYN (CUSTOM PROCEDURE TRAY) ×3 IMPLANT
PAD ARMBOARD 7.5X6 YLW CONV (MISCELLANEOUS) ×3 IMPLANT
RELOAD PROXIMATE 75MM BLUE (ENDOMECHANICALS) ×6 IMPLANT
RELOAD PROXIMATE TA60MM BLUE (ENDOMECHANICALS) ×3 IMPLANT
SPECIMEN JAR X LARGE (MISCELLANEOUS) IMPLANT
SPONGE GAUZE 4X4 12PLY (GAUZE/BANDAGES/DRESSINGS) ×3 IMPLANT
SPONGE LAP 18X18 X RAY DECT (DISPOSABLE) ×3 IMPLANT
STAPLER GUN LINEAR PROX 60 (STAPLE) ×3 IMPLANT
STAPLER PROXIMATE 75MM BLUE (STAPLE) ×3 IMPLANT
STAPLER VISISTAT 35W (STAPLE) ×3 IMPLANT
SUCTION POOLE TIP (SUCTIONS) ×3 IMPLANT
SUT PDS AB 1 TP1 96 (SUTURE) ×6 IMPLANT
SUT SILK 2 0 SH CR/8 (SUTURE) ×6 IMPLANT
SUT SILK 2 0 TIES 10X30 (SUTURE) ×3 IMPLANT
SUT SILK 3 0 SH CR/8 (SUTURE) ×6 IMPLANT
SUT SILK 3 0 TIES 10X30 (SUTURE) ×3 IMPLANT
TOWEL OR 17X24 6PK STRL BLUE (TOWEL DISPOSABLE) ×3 IMPLANT
TOWEL OR 17X26 10 PK STRL BLUE (TOWEL DISPOSABLE) ×3 IMPLANT
TRAY FOLEY CATH 14FRSI W/METER (CATHETERS) ×3 IMPLANT
WATER STERILE IRR 1000ML POUR (IV SOLUTION) IMPLANT
YANKAUER SUCT BULB TIP NO VENT (SUCTIONS) ×3 IMPLANT

## 2012-02-17 NOTE — Anesthesia Procedure Notes (Signed)
Procedure Name: Intubation Date/Time: 02/17/2012 11:31 AM Performed by: Leona Singleton A Pre-anesthesia Checklist: Patient identified Patient Re-evaluated:Patient Re-evaluated prior to inductionOxygen Delivery Method: Circle system utilized Preoxygenation: Pre-oxygenation with 100% oxygen Intubation Type: IV induction and Cricoid Pressure applied Ventilation: Mask ventilation without difficulty Laryngoscope Size: Mac and 3 Grade View: Grade I Tube type: Oral Tube size: 7.5 mm Number of attempts: 1 Airway Equipment and Method: Stylet Placement Confirmation: ETT inserted through vocal cords under direct vision,  positive ETCO2 and breath sounds checked- equal and bilateral Secured at: 22 cm Tube secured with: Tape Dental Injury: Teeth and Oropharynx as per pre-operative assessment

## 2012-02-17 NOTE — Interval H&P Note (Signed)
History and Physical Interval Note:  02/17/2012 9:47 AM  Matthew Brennan  has presented today for surgery, with the diagnosis of obstructing carcinoid tumor  The goals of treatment and the various methods of treatment have been discussed with the patient and family. After consideration of risks, benefits and other options for treatment, the patient has consented to  Procedure(s) (LRB): EXPLORATORY LAPAROTOMY (N/A) as a surgical intervention .  The patients' history has been reviewed today, patient examined, there is  no change in status, he is stable for surgery.  I have reviewed the patients' chart and labs.  Questions were answered to the patient's satisfaction.     Ernestene Mention

## 2012-02-17 NOTE — Op Note (Signed)
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 Derrell Lolling, M.D., FACS  Assistant:                      Abigail Miyamoto, M.D., FACS    Operative Indications:   Matthew Brennan is a 78 y.o. male referred to me by Dr. Juliene Pina for evaluation of abdominal pain, vomiting, and a calcified nodal mass in the right lower quadrant mesentery.  The patient used to live in [place] near [place] but was remarried and moved to [place] in August of 2012. In December 2011, while in [place] he was admitted to the hospital for 24 hours with abdominal pain. He thought he had a bowel obstruction. He was told he had diverticulitis. There was no followup according to him.     Over the past year he states that he gets "blocked" which is manifested by abdominal pain nausea and vomiting. This lasts 24-48 hours. He gets distended. This resolves. This occurs about once every 3 months. His last colonoscopy was 6 years ago. He states that he has lost 10 pounds in the past 2 months. He does not have diarrhea or flushing or diaphoresis. He mostly has hard stools but does have regular stools. He's been eating more liquid, more softer foods lately.      He has not had any prior abdominal surgery. There've been no fevers.      Recent CT scan shows calcified mesenteric nodal mass measuring 3-1/2 cm in the right lower quadrant. Differential considerations include adenocarcinoma, carcinoid or old granulomatous disease. No small bowel tumor is identified. Also noted was a large, fat-containing right inguinal hernia. There were tiny pulmonary  nodules the lung bases. Liver looked normal  Dr. Rhea Belton performed a colonoscopy which showed diverticula of the left colon but no neoplastic mass. CEA level is normal. Chromogranin A is elevated at 54. Urine HIAA is elevated at 14.8. Octreotide scan 01/27/2012 shows abnormal uptake right mid abdomen consistent with the calcified mass. No hepatic mets and no pulmonary mets seen.    I strongly suspect that he has a neuroendocrine tumor metastatic to the small bowel mesenteric nodes. I strongly suspect that he has a partial small bowel obstruction. He is brought to the operating room electively for exploration and resection of his obstructing tumor and definition of his disease.  Operative Findings:       There were multiple small bowel tumors. At least 10 tumors and  some of them quite bulky and obviously causing partial obstruction. There were bulky mesenteric lymph nodes in the central small bowel.  The gross findings were consistent with a malignant carcinoid tumor.  I was able to resect all gross disease but the mesentery was thickened all the way back up to the origin of the superior mesenteric artery. Although this was not nodular, I  was suspicious that the thickening might represent residual disease. There was one small circular target lesion on the more proximal jejunum that did not have a mass effect which I chose to wedge out rather than resect another 2-1/2 feet of small bowel. I ultimately resected 5 feet of mid small bowel with primary anastomosis, and wedged out and the small mass in the proximal jejunum. There was no evidence of any omental disease. There is no evidence of any liver or gastric disease. There is no rectal mass.  Procedure in Detail:          Following the induction of general endotracheal anesthesia a Foley catheter and a nasogastric tube were placed. Surgical time out was performed. Intravenous antibiotics were given. The abdomen was prepped and draped in a sterile  fashion.  Midline laparotomy incision was made starting about 4 cm above the umbilicus and extending about 4 cm below the umbilicus. The fascia was incised in the midline and the abdominal cavity was entered and explored with findings as described above.  After running the small bowel several times and palpating the colon I elected to resect all measurable disease. The first thing I did was wedge out the more proximal target lesion in the mid jejunum. I simply lifted this up with an Allis clamp and placed a stapling device transversely across it and stapled and removed this. This was sent as a separate specimen. This did not narrow the lumen at all. Because of slow oozing I chose to imbricate the staple line with interrupted Lembert sutures of 3-0 silk. Again the lumen did not seem to be compromised at all.  I then defined the extent of resection of the mid small bowel. This was probably a little bit of distal jejunum and some proximal ileum. After defining and palpating all the palpable tumor  I marked the specimen proximally and distally. I checked one more time and could not palpate or see any other tumors in the small intestine proximal or distal to the marking. I then transected the jejunum proximally and ileum distally with the GIA stapling device. The mesentery was taken down. The thinner mesentery was divided with the LigaSure device. The thicker mesentery and the large mesenteric mass was clamped, divided and ligated with 2-0 silk suture ligatures. The specimen was removed and sent as a second  separate  specimen. The abdomen was irrigated with saline. There was no bleeding from the mesentery. The anastomosis between the proximal and distal limbs of small bowel were created with the GIA stapling device. The common defect was examined and there was no bleeding. The common defect was closed with a TA 60 stapling device. We changed our gloves  at this point. The anastomosis was examined circumferentially  and it looked good , there was no  leak or problems. We placed a few extra sutures of 3-0 silk to reinforce the staple line at critical points. The mesentery was closed with interrupted figure-of-eight sutures of 3-0 silk. We then irrigated the abdomen and pelvis with about 3 more liters of saline. We checked very carefully and all the areas of dissection and found that there was no bleeding. Both areas of resection looked good. We brought the omentum back down. The midline fascia was closed with a running suture of #1 double-stranded PDS and the skin closed with skin staples. Clean bandages were placed and the patient taken to recovery in stable condition. EBL 150 cc. Counts correct. Complications none.     Mikey Bussing  Grayce Sessions, M.D., FACS General and Minimally Invasive Surgery Breast and Colorectal Surgery  02/17/2012 1:07 PM

## 2012-02-17 NOTE — Progress Notes (Signed)
UR of chart complete.  

## 2012-02-17 NOTE — Anesthesia Postprocedure Evaluation (Signed)
  Anesthesia Post-op Note  Patient: Matthew Brennan  Procedure(s) Performed: Procedure(s) (LRB): EXPLORATORY LAPAROTOMY (N/A) SMALL BOWEL RESECTION ()  Patient Location: PACU  Anesthesia Type: General  Level of Consciousness: awake, alert  and oriented  Airway and Oxygen Therapy: Patient Spontanous Breathing and Patient connected to nasal cannula oxygen  Post-op Pain: mild  Post-op Assessment: Post-op Vital signs reviewed, Patient's Cardiovascular Status Stable, Respiratory Function Stable, Patent Airway, No signs of Nausea or vomiting and Pain level controlled  Post-op Vital Signs: Reviewed and stable  Complications: No apparent anesthesia complications

## 2012-02-17 NOTE — Anesthesia Preprocedure Evaluation (Addendum)
Anesthesia Evaluation  Patient identified by MRN, date of birth, ID band Patient awake    Reviewed: Allergy & Precautions, H&P , NPO status , Patient's Chart, lab work & pertinent test results  Airway Mallampati: I TM Distance: >3 FB Neck ROM: Full    Dental  (+) Teeth Intact and Dental Advisory Given   Pulmonary pneumonia , COPDformer smoker breath sounds clear to auscultation        Cardiovascular hypertension, Pt. on medications + CAD + dysrhythmias (occasional PVCs) + Valvular Problems/Murmurs MVP Rhythm:Regular Rate:Normal  EF 53% Grade 1 diastolic dysfunction Mild AS Trace MR   Neuro/Psych PSYCHIATRIC DISORDERS Anxiety Depression negative neurological ROS     GI/Hepatic Neg liver ROS, Intermittent N/V with bowel obstruction   Endo/Other  Hypothyroidism Morbid obesity  Renal/GU Renal InsufficiencyRenal disease     Musculoskeletal  (+) Arthritis -, Osteoarthritis,    Abdominal   Peds  Hematology negative hematology ROS (+)   Anesthesia Other Findings   Reproductive/Obstetrics                        Anesthesia Physical Anesthesia Plan  ASA: III  Anesthesia Plan: General   Post-op Pain Management:    Induction: Intravenous  Airway Management Planned: Oral ETT  Additional Equipment:   Intra-op Plan:   Post-operative Plan: Extubation in OR  Informed Consent:   Dental advisory given  Plan Discussed with: CRNA, Anesthesiologist and Surgeon  Anesthesia Plan Comments:         Anesthesia Quick Evaluation

## 2012-02-17 NOTE — Transfer of Care (Signed)
Immediate Anesthesia Transfer of Care Note  Patient: Matthew Brennan  Procedure(s) Performed: Procedure(s) (LRB): EXPLORATORY LAPAROTOMY (N/A) SMALL BOWEL RESECTION ()  Patient Location: PACU  Anesthesia Type: General  Level of Consciousness: awake, alert , oriented and patient cooperative  Airway & Oxygen Therapy: Patient Spontanous Breathing and Patient connected to face mask oxygen  Post-op Assessment: Report given to PACU RN and Post -op Vital signs reviewed and stable  Post vital signs: Reviewed and stable  Complications: No apparent anesthesia complications

## 2012-02-17 NOTE — Progress Notes (Signed)
Arrived to room 5159 from PACU. A/Ox4, moved self from stretcher to be without difficulty. Denies nausea, c/o slight nausea upon movement. Family at bedside, oriented to room and surroundings.

## 2012-02-18 ENCOUNTER — Encounter (HOSPITAL_COMMUNITY): Payer: Self-pay | Admitting: General Surgery

## 2012-02-18 LAB — CBC
HCT: 38.4 % — ABNORMAL LOW (ref 39.0–52.0)
Hemoglobin: 13 g/dL (ref 13.0–17.0)
MCH: 31.2 pg (ref 26.0–34.0)
MCHC: 33.9 g/dL (ref 30.0–36.0)
MCV: 92.1 fL (ref 78.0–100.0)
Platelets: 180 10*3/uL (ref 150–400)
RBC: 4.17 MIL/uL — ABNORMAL LOW (ref 4.22–5.81)
RDW: 14.9 % (ref 11.5–15.5)
WBC: 11.8 10*3/uL — ABNORMAL HIGH (ref 4.0–10.5)

## 2012-02-18 LAB — BASIC METABOLIC PANEL
BUN: 15 mg/dL (ref 6–23)
CO2: 25 mEq/L (ref 19–32)
Calcium: 8.1 mg/dL — ABNORMAL LOW (ref 8.4–10.5)
Chloride: 108 mEq/L (ref 96–112)
Creatinine, Ser: 1.21 mg/dL (ref 0.50–1.35)
GFR calc Af Amer: 65 mL/min — ABNORMAL LOW (ref >90)
GFR calc non Af Amer: 56 mL/min — ABNORMAL LOW (ref >90)
Glucose, Bld: 98 mg/dL (ref 70–99)
Potassium: 5 mEq/L (ref 3.5–5.1)
Sodium: 141 mEq/L (ref 135–145)

## 2012-02-18 MED ORDER — SODIUM CHLORIDE 0.9 % IJ SOLN: 9.0000 mL | INTRAMUSCULAR | Status: DC | PRN

## 2012-02-18 MED ORDER — DIPHENHYDRAMINE HCL 50 MG/ML IJ SOLN: 12.5000 mg | Freq: Four times a day (QID) | INTRAMUSCULAR | Status: DC | PRN

## 2012-02-18 MED ORDER — HYDROMORPHONE 0.3 MG/ML IV SOLN
INTRAVENOUS | Status: AC
  Administered 2012-02-18: 08:00:00
  Filled 2012-02-18: qty 25

## 2012-02-18 MED ORDER — HYDROMORPHONE 0.3 MG/ML IV SOLN
INTRAVENOUS | Status: DC
  Administered 2012-02-18: 0.6 mg via INTRAVENOUS
  Administered 2012-02-18: 0.3 mg via INTRAVENOUS
  Administered 2012-02-18: 09:00:00 via INTRAVENOUS
  Administered 2012-02-19 (×2): 0.3 mg via INTRAVENOUS
  Administered 2012-02-19: 0.6 mg via INTRAVENOUS
  Administered 2012-02-20: 0.3 mg via INTRAVENOUS

## 2012-02-18 MED ORDER — NALOXONE HCL 0.4 MG/ML IJ SOLN: 0.4000 mg | INTRAMUSCULAR | Status: DC | PRN

## 2012-02-18 MED ORDER — ONDANSETRON HCL 4 MG/2ML IJ SOLN: 4.0000 mg | Freq: Four times a day (QID) | INTRAMUSCULAR | Status: DC | PRN

## 2012-02-18 MED ORDER — KETOROLAC TROMETHAMINE 15 MG/ML IJ SOLN
15.0000 mg | Freq: Four times a day (QID) | INTRAMUSCULAR | Status: AC
  Administered 2012-02-18 – 2012-02-19 (×4): 15 mg via INTRAVENOUS
  Filled 2012-02-18 (×4): qty 1

## 2012-02-18 MED ORDER — DIPHENHYDRAMINE HCL 12.5 MG/5ML PO ELIX
12.5000 mg | ORAL_SOLUTION | Freq: Four times a day (QID) | ORAL | Status: DC | PRN
  Filled 2012-02-18: qty 5

## 2012-02-18 MED FILL — Hydromorphone HCl Inj 1 MG/ML: INTRAMUSCULAR | Qty: 1 | Status: AC

## 2012-02-18 NOTE — Progress Notes (Signed)
1 Day Post-Op  Subjective: Alert and stable. Very stoic but says the pain medicine is not completely controlling his pain. Denies nausea. No respiratory difficulties. States he wants to get up and ambulate.  I discussed the operative findings with him. He seems to understand the extent of resection and the extent of his presumed carcinoid tumors, and that there were multiple tumors. Pathology report will probably come out Monday, due to the holiday and he is aware of that.  Lab work has been drawn but is pending at this time.  Objective: Vital signs in last 24 hours: Temp:  [97.5 F (36.4 C)-99.4 F (37.4 C)] 99.1 F (37.3 C) (03/28 0545) Pulse Rate:  [59-107] 86  (03/28 0545) Resp:  [18-23] 18  (03/28 0545) BP: (110-185)/(62-81) 119/71 mmHg (03/28 0545) SpO2:  [94 %-100 %] 96 % (03/28 0545) Weight:  [171 lb (77.565 kg)] 171 lb (77.565 kg) (03/27 1701) Last BM Date: 02/17/12  Intake/Output from previous day: 03/27 0701 - 03/28 0700 In: 4802 [I.V.:4802] Out: 1725 [Urine:1375; Emesis/NG output:250; Blood:100] Intake/Output this shift:    General appearance: alert. Does not appear to be any distress. NG drainage is very thin and not very high volume. Resp: clear to auscultation bilaterally GI: abdomen is soft, appropriately tender, bowel sounds absent, nondistended. Wound clean and dry.  Lab Results:  No results found for this or any previous visit (from the past 24 hour(s)).   Studies/Results: @RISRSLT24 @     . antiseptic oral rinse  15 mL Mouth Rinse q12n4p  . chlorhexidine  15 mL Mouth Rinse BID  . ertapenem  1 g Intravenous 60 min Pre-Op  . ertapenem (INVANZ) IV  1 g Intravenous Q24H  . heparin  5,000 Units Subcutaneous Once  . heparin  5,000 Units Subcutaneous Q8H  . HYDROmorphone      . ketorolac  15 mg Intravenous Q6H  . metoprolol  5 mg Intravenous Q6H  . morphine      . morphine      . ondansetron      . testosterone  5 g Transdermal Daily  . DISCONTD:  chlorhexidine  1 application Topical Once  . DISCONTD: morphine   Intravenous Q4H     Assessment/Plan: s/p Procedure(s): EXPLORATORY LAPAROTOMY SMALL BOWEL RESECTION  POD #1 - resection multiple presumed malignant carcinoid tumors of mid small bowel Stable Check lab work For pain control, we will switch to Dilaudid PCA and add Toradol IV for 4 doses. Mobilize DC Foley tomorrow Continue NG tube until ileus shows signs of resolution.  Hypertension. On IV low-pressure every 6 hours.  VTE prophylaxis. To begin subcutaneous heparin today.   Patient Active Hospital Problem List: No active hospital problems.   LOS: 1 day    Florentine Diekman M. Derrell Lolling, M.D., [facility] General and Minimally invasive Surgery Breast and Colorectal Surgery Office:   [phone] Pager:   [phone]  02/18/2012  . .prob

## 2012-02-19 NOTE — Progress Notes (Signed)
General Surgery Note  LOS: 2 days  POD# 2  Assessment/Plan: 1.  EXPLORATORY LAPAROTOMY, SMALL BOWEL RESECTION  For carcinoid tumor-  H. Derrell Lolling - 02/17/2012  Daughter in law in room.  Feels okay, but  No bowel function.  Keep NGT for now.  2.  Hypertension. 3.  VTE prophylaxis - on SQ heparin. 4.  Foley removed today  Subjective:  Doing okay.  Has been walking. Objective:   Filed Vitals:   02/19/12 0547  BP: 155/77  Pulse: 68  Temp: 97.3 F (36.3 C)  Resp: 18     Intake/Output from previous day:  03/28 0701 - 03/29 0700 In: 2988 [I.V.:2988] Out: 1900 [Urine:1200; Emesis/NG output:700]  Intake/Output this shift:  Total I/O In: -  Out: 100 [Emesis/NG output:100]   Physical Exam:   General: WN older WM who is alert and oriented.    HEENT: Normal. Pupils equal. Has NGT in place. .   Lungs: Clear.   Abdomen: Soft, but quiet.   Wound: okay.   Neurologic:  Grossly intact to motor and sensory function.   Psychiatric: Has normal mood and affect. Behavior is normal   Lab Results:    Basename 02/18/12 0650  WBC 11.8*  HGB 13.0  HCT 38.4*  PLT 180    BMET   Basename 02/18/12 0650  NA 141  K 5.0  CL 108  CO2 25  GLUCOSE 98  BUN 15  CREATININE 1.21  CALCIUM 8.1*    PT/INR  No results found for this basename: LABPROT:2,INR:2 in the last 72 hours  ABG  No results found for this basename: PHART:2,PCO2:2,PO2:2,HCO3:2 in the last 72 hours   Studies/Results:  No results found.   Anti-infectives:   Anti-infectives     Start     Dose/Rate Route Frequency Ordered Stop   02/18/12 1000   ertapenem (INVANZ) 1 g in sodium chloride 0.9 % 50 mL IVPB        1 g 100 mL/hr over 30 Minutes Intravenous Every 24 hours 02/17/12 1451 02/18/12 1221   02/16/12 1407   ertapenem (INVANZ) 1 g in sodium chloride 0.9 % 50 mL IVPB        1 g 100 mL/hr over 30 Minutes Intravenous 60 min pre-op 02/16/12 1407 02/17/12 1137          Ovidio Kin, MD, FACS Pager: [phone],    [facility] Office: [phone] 02/19/2012

## 2012-02-20 MED ORDER — IRBESARTAN 300 MG PO TABS
300.0000 mg | ORAL_TABLET | Freq: Every day | ORAL | Status: DC
  Administered 2012-02-21 – 2012-02-22 (×2): 300 mg via ORAL
  Filled 2012-02-20 (×3): qty 1

## 2012-02-20 MED ORDER — HYDROCODONE-ACETAMINOPHEN 5-325 MG PO TABS: 1.0000 | ORAL_TABLET | ORAL | Status: DC | PRN

## 2012-02-20 MED ORDER — HYDROCHLOROTHIAZIDE 25 MG PO TABS
25.0000 mg | ORAL_TABLET | Freq: Every day | ORAL | Status: DC
  Administered 2012-02-21 – 2012-02-22 (×2): 25 mg via ORAL
  Filled 2012-02-20 (×4): qty 1

## 2012-02-20 NOTE — Progress Notes (Signed)
General Surgery Note  LOS: 3 days  POD# 3  Assessment/Plan: 1.  EXPLORATORY LAPAROTOMY, SMALL BOWEL RESECTION  For carcinoid tumor-  H. Derrell Lolling - 02/17/2012  Passed flatus.  NGT removed.  Keep on ice chips for now.  2.  Hypertension. 3.  VTE prophylaxis - on SQ heparin. 4.  Foley removed - urinating okay.  Subjective:  Doing okay.  Has been walking. Passed flatus. Objective:   Filed Vitals:   02/20/12 0753  BP:   Pulse:   Temp:   Resp: 22     Intake/Output from previous day:  03/29 0701 - 03/30 0700 In: 2965.4 [I.V.:2965.4] Out: 2150 [Urine:975; Emesis/NG output:1175]  Intake/Output this shift:      Physical Exam:   General: WN older WM who is alert and oriented.    HEENT: Normal. Pupils equal. Has NGT in place. .   Lungs: Clear.   Abdomen: Soft, very active bowel sounds.   Wound: okay.   Neurologic:  Grossly intact to motor and sensory function.   Psychiatric: Has normal mood and affect. Behavior is normal   Lab Results:     Basename 02/18/12 0650  WBC 11.8*  HGB 13.0  HCT 38.4*  PLT 180    BMET    Basename 02/18/12 0650  NA 141  K 5.0  CL 108  CO2 25  GLUCOSE 98  BUN 15  CREATININE 1.21  CALCIUM 8.1*    PT/INR  No results found for this basename: LABPROT:2,INR:2 in the last 72 hours  ABG  No results found for this basename: PHART:2,PCO2:2,PO2:2,HCO3:2 in the last 72 hours   Studies/Results:  No results found.   Anti-infectives:   Anti-infectives     Start     Dose/Rate Route Frequency Ordered Stop   02/18/12 1000   ertapenem (INVANZ) 1 g in sodium chloride 0.9 % 50 mL IVPB        1 g 100 mL/hr over 30 Minutes Intravenous Every 24 hours 02/17/12 1451 02/18/12 1221   02/16/12 1407   ertapenem (INVANZ) 1 g in sodium chloride 0.9 % 50 mL IVPB        1 g 100 mL/hr over 30 Minutes Intravenous 60 min pre-op 02/16/12 1407 02/17/12 1137          Ovidio Kin, MD, FACS Pager: [phone],   [facility] Office:  [phone] 02/20/2012

## 2012-02-20 NOTE — Progress Notes (Signed)
BP 170's sys, 70's  Diastolic. Metoprolol not given due to low HR, MD notified, no new order at this time.  Will continue to monitor

## 2012-02-20 NOTE — Progress Notes (Signed)
BP 185/85, PR 48 when asleep , 53-54 when awake, no complaints .  Dr . Carolynne Edouard notified , see new order

## 2012-02-21 MED ORDER — HYDROCODONE-ACETAMINOPHEN 5-325 MG PO TABS: 1.0000 | ORAL_TABLET | ORAL | Status: DC | PRN

## 2012-02-21 NOTE — Progress Notes (Signed)
General Surgery Note  LOS: 4 days  POD# 4  Assessment/Plan: 1.  EXPLORATORY LAPAROTOMY, SMALL BOWEL RESECTION  For carcinoid tumor-  H. Derrell Lolling - 02/17/2012  Path - Low grade carcinoid, 11/20 nodes positive.  I discussed this with him.  Doing very well.  Will start clr liquids.   2.  Hypertension. 3.  VTE prophylaxis - on SQ heparin.  Subjective:  Doing okay.  Had 5 BM's.  Ready to go. Objective:   Filed Vitals:   02/21/12 0745  BP: 180/85  Pulse: 50  Temp:   Resp:      Intake/Output from previous day:  03/30 0701 - 03/31 0700 In: 1250 [I.V.:1250] Out: 825 [Urine:825]  Intake/Output this shift:      Physical Exam:   General: WN older WM who is alert and oriented. Looks good.   HEENT: Normal. Pupils equal. Has NGT in place. .   Lungs: Clear.   Abdomen: Soft, very active bowel sounds.   Wound: okay.   Neurologic:  Grossly intact to motor and sensory function.   Psychiatric: Has normal mood and affect. Behavior is normal   Lab Results:    No results found for this basename: WBC:2,HGB:2,HCT:2,PLT:2 in the last 72 hours  BMET   No results found for this basename: NA:2,K:2,CL:2,CO2:2,GLUCOSE:2,BUN:2,CREATININE:2,CALCIUM:2 in the last 72 hours  PT/INR  No results found for this basename: LABPROT:2,INR:2 in the last 72 hours  ABG  No results found for this basename: PHART:2,PCO2:2,PO2:2,HCO3:2 in the last 72 hours   Studies/Results:  No results found.   Anti-infectives:   Anti-infectives     Start     Dose/Rate Route Frequency Ordered Stop   02/18/12 1000   ertapenem (INVANZ) 1 g in sodium chloride 0.9 % 50 mL IVPB        1 g 100 mL/hr over 30 Minutes Intravenous Every 24 hours 02/17/12 1451 02/18/12 1221   02/16/12 1407   ertapenem (INVANZ) 1 g in sodium chloride 0.9 % 50 mL IVPB        1 g 100 mL/hr over 30 Minutes Intravenous 60 min pre-op 02/16/12 1407 02/17/12 1137          Ovidio Kin, MD, FACS Pager: [phone],   [facility] Office:  [phone] 02/21/2012

## 2012-02-22 ENCOUNTER — Telehealth (INDEPENDENT_AMBULATORY_CARE_PROVIDER_SITE_OTHER): Payer: Self-pay | Admitting: General Surgery

## 2012-02-22 NOTE — Progress Notes (Signed)
Pt given dc instructions. Rn went over medications and incision/wound care. Pt verbalized understanding and had no questions regarding care of instructions.  

## 2012-02-22 NOTE — Discharge Instructions (Signed)
[facility]      [facility] [phone]  OPEN ABDOMINAL SURGERY: POST OP INSTRUCTIONS  Always review your discharge instruction sheet given to you by the facility where your surgery was performed.  IF YOU HAVE DISABILITY OR FAMILY LEAVE FORMS, YOU MUST BRING THEM TO THE OFFICE FOR PROCESSING.  PLEASE DO NOT GIVE THEM TO YOUR DOCTOR.  1. A prescription for pain medication may be given to you upon discharge.  Take your pain medication as prescribed, if needed.  If narcotic pain medicine is not needed, then you may take acetaminophen (Tylenol) or ibuprofen (Advil) as needed. 2. Take your usually prescribed medications unless otherwise directed. 3. If you need a refill on your pain medication, please contact your pharmacy. They will contact our office to request authorization.  Prescriptions will not be filled after 5pm or on week-ends. 4. You should follow a light diet the first few days after arrival home, such as soup and crackers, pudding, etc.unless your doctor has advised otherwise. A high-fiber, low fat diet can be resumed as tolerated.   Be sure to include lots of fluids daily. Most patients will experience some swelling and bruising on the chest and neck area.  Ice packs will help.  Swelling and bruising can take several days to resolve 5. Most patients will experience some swelling and bruising in the area of the incision. Ice pack will help. Swelling and bruising can take several days to resolve..  6. It is common to experience some constipation if taking pain medication after surgery.  Increasing fluid intake and taking a stool softener will usually help or prevent this problem from occurring.  A mild laxative (Milk of Magnesia or Miralax) should be taken according to package directions if there are no bowel movements after 48 hours. 7.  You may have steri-strips (small skin tapes) in place directly over the incision.  These strips should be left on the skin for 7-10 days.  If your  surgeon used skin glue on the incision, you may shower in 24 hours.  The glue will flake off over the next 2-3 weeks.  Any sutures or staples will be removed at the office during your follow-up visit. You may find that a light gauze bandage over your incision may keep your staples from being rubbed or pulled. You may shower and replace the bandage daily. 8. ACTIVITIES:  You may resume regular (light) daily activities beginning the next day--such as daily self-care, walking, climbing stairs--gradually increasing activities as tolerated.  You may have sexual intercourse when it is comfortable.  Refrain from any heavy lifting or straining until approved by your doctor. a. You may drive when you no longer are taking prescription pain medication, you can comfortably wear a seatbelt, and you can safely maneuver your car and apply brakes b. Return to Work: ___________________________________ 9. You should see your doctor in the office for a follow-up appointment approximately two weeks after your surgery.  Make sure that you call for this appointment within a day or two after you arrive home to insure a convenient appointment time. OTHER INSTRUCTIONS:  _____________________________________________________________ _____________________________________________________________  WHEN TO CALL YOUR DOCTOR: 1. Fever over 101.0 2. Inability to urinate 3. Nausea and/or vomiting 4. Extreme swelling or bruising 5. Continued bleeding from incision. 6. Increased pain, redness, or drainage from the incision. 7. Difficulty swallowing or breathing 8. Muscle cramping or spasms. 9. Numbness or tingling in hands or feet or around lips.  The clinic staff is available to   answer your questions during regular business hours.  Please don't hesitate to call and ask to speak to one of the nurses if you have concerns.  For further questions, please visit [email or web address]   

## 2012-02-22 NOTE — Progress Notes (Signed)
Patient ID: Matthew Brennan, male   DOB: 1932-03-24, 78 y.o.   MRN: [id number]  Doing well Tolerating po +BM  Abdomen soft  Plan possible d/c later today

## 2012-02-27 NOTE — Discharge Summary (Signed)
Patient ID: Matthew Brennan [id number] 79 y.o. 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 male referred to me by Dr. Juliene Brennan for evaluation of abdominal pain, vomiting, and a calcified nodal mass in the right lower quadrant mesentery.  The patient used to live in [place] near [place] but was remarried and moved to [place] in August of 2012. In December 2011, while in [place] he was admitted to the hospital for 24 hours with abdominal pain. He thought he had a bowel obstruction. He was told he had diverticulitis. There was no followup according to him.  Over the past year he states that he gets "blocked" which is manifested by abdominal pain nausea and vomiting. This lasts 24-48 hours. He gets distended. This resolves. This occurs about once every 3 months. His last colonoscopy was 6 years ago. He states that he has lost 10 pounds in the past 2 months. He does not have diarrhea or flushing or diaphoresis. He mostly has hard stools but does have regular stools. He's been eating more liquid, more softer foods lately.  He has not had any prior abdominal surgery. There've been no fevers.  Recent CT scan shows calcified mesenteric nodal mass measuring 3-1/2 cm in the right lower quadrant. Differential considerations include adenocarcinoma, carcinoid or old granulomatous disease. No small bowel tumor is identified. Also noted was a large, fat-containing right inguinal hernia. There were tiny pulmonary  nodules the lung bases. Liver looked normal  Dr. Rhea Brennan performed a colonoscopy which showed diverticula of the left colon but no neoplastic mass. CEA level is normal. Chromogranin A is elevated at 54. Urine HIAA is elevated at 14.8. Octreotide scan 01/27/2012 shows abnormal uptake right mid abdomen consistent with the calcified mass. No hepatic mets and no pulmonary mets seen.  I strongly suspect that he has a neuroendocrine tumor metastatic to the small bowel mesenteric nodes. I strongly suspect that he has a partial small bowel obstruction. He is brought to the operating room electively for exploration and resection of his obstructing tumor and definition of his disease   Hospital Course: on the day of admission the patient was taken to the operating room. He underwent exploratory laparotomy.operative findings revealed multiple small bowel tumors, at least 10 tumors, some quite bulky and obviously causing partial obstruction. There were bulky mesenteric lymph nodes in the central small bowel mesentery. I was able to resect all gross disease but the mesentery was thickened I went back up to the origin of the superior mesenteric artery. It is possible he has residual disease. There was no sign of any metastasis to the colon or rectum or omentum or liver. There was one small circular target lesion on the more proximal jejunum which I chose to wedge out.  Final pathology report showed multiple small bowel neuroendocrine tumors and multiple lymph node metastasis. The small target lesion of the proximal small bowel mesentery was benign. Pathologic stage T4, N1  The patient progressed in his diet and activities and was ready for discharge on April 1. At that time he had had bowel movements and was tolerating a regular diet  and his wound was healing well.  Prescription for pain medication was given. Diet and activities were discussed. He was to return to see Dr. Derrell Brennan in 1 week for staple removal. He was  instructed that he would be referred to a medical oncologist as an outpatient.  Consults: None  Significant Diagnostic Studies: pathology  Treatments: surgery: exploratory laparotomy, resection 5 feet of small bowel, biopsy small bowel lesion without anastomosis.  Disposition: Home  Patient Instructions:   Matthew, Brennan  Home Medication Instructions WUJ:[id number]   Printed on:02/27/12 1106  Medication Information                    valsartan-hydrochlorothiazide (DIOVAN-HCT) 320-25 MG per tablet Take 1 tablet by mouth daily.           Ascorbic Acid (VITAMIN C) 1000 MG tablet Take 1,000 mg by mouth daily.           vitamin E 400 UNIT capsule Take 800 Units by mouth daily.           testosterone (ANDROGEL) 50 MG/5GM GEL Place 5 g onto the skin daily.           VITAMIN D, ERGOCALCIFEROL, PO Take 800 Units by mouth daily.           UNABLE TO FIND Med Name: ETDA.  Takes 18 drops daily           PRESCRIPTION MEDICATION Take 2 capsules by mouth daily. Liothyronine 5.1 mcg/Levothyroxine 16.8 mcg/Selenuim 0.11 mg/Zinc 50 mg. Prescription from [facility]  [phone]             Activity: no sports or heavy lifting for 4 weeks. Okay to drive a car in 7-84 days. Diet: low fat, low cholesterol diet Wound Care: as directed  Follow-up:  With Dr. Derrell Brennan in 1 week.  Signed: Angelia Brennan. Matthew Brennan, M.D., FACS General and minimally invasive surgery Breast and Colorectal Surgery  02/27/2012, 11:06 AM

## 2012-02-29 ENCOUNTER — Other Ambulatory Visit (INDEPENDENT_AMBULATORY_CARE_PROVIDER_SITE_OTHER): Payer: Self-pay

## 2012-02-29 ENCOUNTER — Ambulatory Visit (INDEPENDENT_AMBULATORY_CARE_PROVIDER_SITE_OTHER): Payer: Medicare HMO | Admitting: General Surgery

## 2012-02-29 ENCOUNTER — Encounter (INDEPENDENT_AMBULATORY_CARE_PROVIDER_SITE_OTHER): Payer: Self-pay | Admitting: General Surgery

## 2012-02-29 VITALS — BP 118/66 | HR 76 | Temp 97.9°F | Resp 18 | Ht 69.0 in | Wt 166.6 lb

## 2012-02-29 DIAGNOSIS — C7A011 Malignant carcinoid tumor of the jejunum: Secondary | ICD-10-CM | POA: Insufficient documentation

## 2012-02-29 DIAGNOSIS — D3A00 Benign carcinoid tumor of unspecified site: Secondary | ICD-10-CM

## 2012-02-29 NOTE — Patient Instructions (Signed)
You are recovering from your abdominal surgery without any detectable complications.  I encourage you to take a walk every day. You may drive a car. No sports or lifting more than 20 pounds for 5 weeks from the date of surgery.  You will be referred to a medical oncologist for their opinion and evaluation regarding your carcinoid tumor.  You will be given an appointment to return to see Dr. Derrell Lolling in 5 weeks.

## 2012-02-29 NOTE — Progress Notes (Signed)
Subjective:     Patient ID: Matthew Brennan, male   DOB: 1932-06-14, 78 y.o.   MRN: [id number]  HPI This 78 year old gentleman underwent exploratory laparotomy, resection of 5 feet of small bowel and biopsy of jejunal mass on February 17, 2012. The jejunal mass was benign. He had multiple carcinoid tumors of the distal jejunum and proximal ileum. One or 2 of these were partially obstructing. 11/20 lymph nodes were positive for metastatic carcinoid. There was no evidence of metastasis to the omentum or liver.  Final pathology confirmed low-grade neuroendocrine tumor, stage T4, N1.  He has done well. He is tolerating a regular diet. He has one well formed bowel movement per day. No history of carcinoid syndrome preop or postop. He has stopped taking pain medicine. He want to know if he can return to the gym.  Review of Systems     Objective:   Physical Exam Patient looks well. In no distress.  Abdomen soft flat nontender. Midline incision healing uneventfully. Staples removed. Steri-Strips applied.    Assessment:     Malignant carcinoid tumor of mid small bowel, recovering uneventfully in the early postop period following segmental small bowel resection.  Pathologic stage T4, N1.    Plan:     Diet and activities discussed. No sports or heavy lifting for 5 weeks from the date of surgery.  The patient will be referred to medical oncology for their opinion  Return to see me in 5 weeks.   Angelia Mould. Derrell Lolling, M.D., [facility] General and Minimally invasive Surgery Breast and Colorectal Surgery Office:   [phone] Pager:   [phone]

## 2012-03-08 ENCOUNTER — Telehealth: Payer: Self-pay | Admitting: Oncology

## 2012-03-08 NOTE — Telephone Encounter (Signed)
called pt and scheduled appt for 04/29.  will send a letter to Dr. Derrell Lolling with appt d/t

## 2012-03-21 ENCOUNTER — Ambulatory Visit: Payer: Medicare HMO

## 2012-03-21 ENCOUNTER — Telehealth: Payer: Self-pay | Admitting: Oncology

## 2012-03-21 ENCOUNTER — Ambulatory Visit (HOSPITAL_BASED_OUTPATIENT_CLINIC_OR_DEPARTMENT_OTHER): Payer: Medicare HMO | Admitting: Oncology

## 2012-03-21 ENCOUNTER — Encounter: Payer: Self-pay | Admitting: Oncology

## 2012-03-21 VITALS — BP 149/83 | HR 74 | Temp 97.0°F | Ht 69.0 in | Wt 166.8 lb

## 2012-03-21 DIAGNOSIS — C7A011 Malignant carcinoid tumor of the jejunum: Secondary | ICD-10-CM

## 2012-03-21 NOTE — Telephone Encounter (Signed)
Gv pt appt for [id number] and [id number]

## 2012-03-21 NOTE — Progress Notes (Signed)
Met with patient and explained navigator role.  Information given re: disease and resource numbers provided. Patient was appreciative and without questions.  Per MD, patient will be observation.

## 2012-03-21 NOTE — Progress Notes (Signed)
Patient came in today as a new patient with his wife,he has one insurance.I did explain to him and wife the programs we offer here at the cancer center,the co-pay assistance for medication and co-pay assistance for chemo drugs.I gave him my card and they said they would call if they need any assistance.

## 2012-03-21 NOTE — Progress Notes (Signed)
Referring MD: Raquel Sarna D Schmoll 78 y.o.  1932/03/10    Reason for Referral: Carcinoid tumor of the small bowel     HPI: He reports developing a "blockage "of the bowel while living in [place] approximately 3 years ago. No specific diagnosis was made. He did not undergo surgery. He developed similar symptoms in December of 2012. Pain and abdominal and distention improved after approximately 4 days while on a liquid diet. He continued to liquid diet until he saw Dr.Alm. A CT abdomen and pelvis on 12/23/2011. The liver appeared normal. Distended loops of small bowel were present in the right lower quadrant with fluid levels. Tethering of small bowel loops and a cluster mesenteric nodules was noted in the left lower quadrant with central calcification. Severe diverticulosis of the colon without diverticulitis. Tiny nodules were noted the lung bases.  He was referred to Dr. Claud Kelp. An octreotide scan on 01/27/2012 revealed abnormal uptake within the right abdomen corresponding to the calcified mesenteric mass consistent with a neuroendocrine tumor. No evidence of liver metastases. No evidence of thoracic metastases.  Dr. Radene Journey performed a colonoscopy on 01/18/2012 the terminal ileum appeared normal. Severe diverticulosis was found in the left colon. There were internal hemorrhoids.  He was taken to the operating room on 02/17/2012. Multiple small bowel tumors were found causing partial obstruction. There were bulky mesenteric lymph nodes in the central small bowel. All gross disease was resected, but the mesentery was thickened to the origin of the SMA. This area was not nodular, but the thickening was potentially felt to represent residual disease. One small lesion was away out of the proximal jejunum. 5 feet of mid small bowel were resected. No evidence of omental disease. No evidence of liver or gastric disease. No rectal mass.  The pathology ([id number]) revealed  benign small bowel mucosa at the jejunum biopsy. Multiple foci of a low-grade neuroendocrine tumor were noted involving the resected small intestine with the largest measuring 2.5 cm. Angiolymphatic invasion was present. 11 of 20 lymph nodes were positive for metastatic carcinoid tumor. Tumor was noted to invade through the muscular propria into the fatty tissue and involved the serosa. The circumferential margin was focally involved. Perineural invasion was present.  He reports no omental operative recovery. He is now eating a regular diet.  Past Medical History  Diagnosis Date  . Mitral valve prolapse     last visit w/ cardiologist 1999 in [place]  . Obesity   . Testosterone deficiency   . ED (erectile dysfunction)       . Hx SBO   . CAD (coronary artery disease)           . Diverticulosis of colon without diverticulitis   . Dysrhythmia     palpitations from MVP occ  . Pneumonia     as a child  . Enlarged prostate with lower urinary tract symptoms (LUTS)       . Recurrent upper respiratory infection (URI)   . Carcinoid tumor (T4 N1)  02/17/2012    low grade neuroendocrine  . Blood transfusion 1992    s/p GI bleed  . Heart murmur   . Hypertension           . Hypothyroidism     Past Surgical History  Procedure Date  .    Matthew Brennan Eye surgery 2000,2003    cataract ext/iol bilateral  . No past surgeries   . Mouth surgery [id number] 2004  . Exploritory laperotmy with  small bowel resection 02/17/2012  . Laparotomy 02/17/2012    Procedure: EXPLORATORY LAPAROTOMY;  Surgeon: Ernestene Mention, MD;  Location[facility];  Service: General;  Laterality: N/A;  . Bowel resection 02/17/2012    Procedure: SMALL BOWEL RESECTION;  Surgeon: Ernestene Mention, MD;  Location: [facility];  Service: General;;  small bowel resection and wedge resection small  bowel mass.    Family History  Problem Relation Age of Onset  .  6 children are in good health.     .  one sister and one brother, no family history of  cancer     .     Matthew Brennan       Current outpatient prescriptions:Ascorbic Acid (VITAMIN C) 1000 MG tablet, Take 3,000 mg by mouth daily. , Disp: , Rfl: ;  Multiple Vitamins-Minerals (MULTIVITAL PO), Take 1 capsule by mouth daily. One-A-Day, Disp: , Rfl: ;  PRESCRIPTION MEDICATION, Take 2 capsules by mouth daily. Liothyronine 5.1 mcg/Levothyroxine 16.8 mcg/Selenuim 0.11 mg/Zinc 50 mg. Prescription from [facility]  [phone], Disp: , Rfl:  testosterone (ANDROGEL) 50 MG/5GM GEL, Place 5 g onto the skin daily., Disp: , Rfl: ;  UNABLE TO FIND, Med Name: ETDA.  Takes 18 drops daily, Disp: , Rfl: ;  valsartan-hydrochlorothiazide (DIOVAN-HCT) 320-25 MG per tablet, Take 1 tablet by mouth daily., Disp: , Rfl: ;  VITAMIN D, ERGOCALCIFEROL, PO, Take 800 Units by mouth daily., Disp: , Rfl: ;  vitamin E 400 UNIT capsule, Take 800 Units by mouth daily., Disp: , Rfl:   Allergies:  Allergies  Allergen Reactions  . Aspirin Other (See Comments)    GI bleed  . Ciprofloxacin Rash    All over the body  . Levothyroxine Sodium Rash    All over the body    Social History: He is retired after working in [employer]. He lives with his wife in [place]. He does not use tobacco or alcohol. He received a red cell transfusion when he had a GI bleed in the 1990s. He was in the [employer]	[place] from [phone]. No risk factors for HIV or hepatitis.    History  Alcohol Use No    History  Smoking status  . Former Smoker -- 4.0 packs/day for 5 years  . Types: Cigarettes  . Quit date: 01/06/1962  Smokeless tobacco  . Never Used     ROS:   Positives include: 30 pound weight loss prior to surgery, mild droop of the right eyes since surgery.  A complete ROS was otherwise negative. Including no fever, flushing, or diarrhea prior to surgery  Physical Exam:  Blood pressure 149/83, pulse 74, temperature 97 F (36.1 C), temperature source Oral, height 5\' 9"  (1.753 m), weight 166 lb 12.8 oz (75.66  kg).  HEENT: Oropharynx without visible mass, neck without mass Lungs: Lungs clear bilaterally Cardiac: Regular rate and rhythm Abdomen: Healed midline incision, no hepatosplenomegaly, no mass GU: ? Left hydrocele, testes without mass, uncircumcised  Vascular: No leg edema Lymph nodes: No cervical, supraclavicular, axillary, or inguinal nodes Neurologic: Alert and oriented, motor exam appears grossly intact Skin: No rash    LAB:  CBC  Lab Results  Component Value Date   WBC 11.8* 02/18/2012   HGB 13.0 02/18/2012   HCT 38.4* 02/18/2012   MCV 92.1 02/18/2012   PLT 180 02/18/2012     CMP      Component Value Date/Time   NA 141 02/18/2012 0650   K 5.0 02/18/2012 0650  CL 108 02/18/2012 0650   CO2 25 02/18/2012 0650   GLUCOSE 98 02/18/2012 0650   BUN 15 02/18/2012 0650   CREATININE 1.21 02/18/2012 0650   CALCIUM 8.1* 02/18/2012 0650   PROT 6.5 02/09/2012 1347   ALBUMIN 3.4* 02/09/2012 1347   AST 20 02/09/2012 1347   ALT 21 02/09/2012 1347   ALKPHOS 120* 02/09/2012 1347   BILITOT 0.4 02/09/2012 1347   GFRNONAA 56* 02/18/2012 0650   GFRAA 65* 02/18/2012 0650   Chromogranin A- 54 on 01/07/2012 24-hour urine 5 HIAA-14.8 on 01/07/2012  Radiology: As per history of present illness   Assessment/Plan:   1. 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 History of a partial small bowel obstruction secondary to the small bowel carcinoid tumor  3. Elevated preoperative chromogranin A and 24-hour urine 5 HIAA levels   Disposition:   He has undergone a small bowel resection for treatment of a bowel obstruction caused by carcinoid tumors of the small bowel. I discussed the diagnosis and treatment options with the patient and his wife. I explained there is  no role for "adjuvant" therapy in patients with resected carcinoid tumors. He had a low-grade tumor that appears to have been present for several years prior to undergoing surgical resection. However he does have a significant risk of developing recurrent disease based on the clinical findings at the time of surgery and the surgical pathology.  He will be scheduled for a postoperative baseline 24 hour urine 5 HIAA and chromogranin A level within the next few weeks. He will return for an office visit and repeat laboratory studies in 8 months.    Quan Cybulski 03/21/2012, 5:27 PM

## 2012-03-22 NOTE — Progress Notes (Signed)
Ladene Artist, MD Physician Signed  03/21/2012 5:27 PM    Referring MD: Dorene Sorrow David 78 y.o.   07/08/1932      Reason for Referral: Carcinoid tumor of the small bowel         HPI: He reports developing a "blockage "of the bowel while living in [place] approximately 3 years ago. No specific diagnosis was made. He did not undergo surgery. He developed similar symptoms in December of 2012. Pain and abdominal and distention improved after approximately 4 days while on a liquid diet. He continued to liquid diet until he saw Dr.Alm. A CT abdomen and pelvis on 12/23/2011. The liver appeared normal. Distended loops of small bowel were present in the right lower quadrant with fluid levels. Tethering of small bowel loops and a cluster mesenteric nodules was noted in the left lower quadrant with central calcification. Severe diverticulosis of the colon without diverticulitis. Tiny nodules were noted the lung bases.   He was referred to Dr. Claud Kelp. An octreotide scan on 01/27/2012 revealed abnormal uptake within the right abdomen corresponding to the calcified mesenteric mass consistent with a neuroendocrine tumor. No evidence of liver metastases. No evidence of thoracic metastases.   Dr. Radene Journey performed a colonoscopy on 01/18/2012 the terminal ileum appeared normal. Severe diverticulosis was found in the left colon. There were internal hemorrhoids.   He was taken to the operating room on 02/17/2012. Multiple small bowel tumors were found causing partial obstruction. There were bulky mesenteric lymph nodes in the central small bowel. All gross disease was resected, but the mesentery was thickened to the origin of the SMA. This area was not nodular, but the thickening was potentially felt to represent residual disease. One small lesion was away out of the proximal jejunum. 5 feet of mid small bowel were resected. No evidence of omental disease. No evidence of  liver or gastric disease. No rectal mass.   The pathology ([id number]) revealed benign small bowel mucosa at the jejunum biopsy. Multiple foci of a low-grade neuroendocrine tumor were noted involving the resected small intestine with the largest measuring 2.5 cm. Angiolymphatic invasion was present. 11 of 20 lymph nodes were positive for metastatic carcinoid tumor. Tumor was noted to invade through the muscular propria into the fatty tissue and involved the serosa. The circumferential margin was focally involved. Perineural invasion was present.   He reports no omental operative recovery. He is now eating a regular diet.    Past Medical History   Diagnosis  Date   .  Mitral valve prolapse         last visit w/ cardiologist 1999 in [place]   .  Obesity     .  Testosterone deficiency     .  ED (erectile dysfunction)            .  Hx SBO     .  CAD (coronary artery disease)                   .  Diverticulosis of colon without diverticulitis     .  Dysrhythmia         palpitations from MVP occ   .  Pneumonia         as a child   .  Enlarged prostate with lower urinary tract symptoms (LUTS)            .  Recurrent upper respiratory infection (URI)     .  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  Blood transfusion  1992       s/p GI bleed   .  Heart murmur     .  Hypertension                   .  Hypothyroidism           Past Surgical History   Procedure  Date   .       Marland Kitchen  Eye surgery  2000,2003       cataract ext/iol bilateral   .  No past surgeries     .  Mouth surgery  4098.1191 2004   .  Exploritory laperotmy with small bowel resection  02/17/2012   .  Laparotomy  02/17/2012       Procedure: EXPLORATORY LAPAROTOMY;  Surgeon: Ernestene Mention, MD;  Location: [facility] OR;  Service: General;  Laterality: N/A;   .  Bowel resection  02/17/2012       Procedure: SMALL BOWEL RESECTION;  Surgeon: Ernestene Mention, MD;  Location: [facility] OR;  Service: General;;   small bowel resection and wedge resection small  bowel mass.         Family History   Problem  Relation  Age of Onset   .   6 children are in good health.        .   one sister and one brother, no family history of cancer        .         Marland Kitchen              Current outpatient prescriptions:Ascorbic Acid (VITAMIN C) 1000 MG tablet, Take 3,000 mg by mouth daily. , Disp: , Rfl: ;  Multiple Vitamins-Minerals (MULTIVITAL PO), Take 1 capsule by mouth daily. One-A-Day, Disp: , Rfl: ;  PRESCRIPTION MEDICATION, Take 2 capsules by mouth daily. Liothyronine 5.1 mcg/Levothyroxine 16.8 mcg/Selenuim 0.11 mg/Zinc 50 mg. Prescription from [facility]  [phone], Disp: , Rfl:   testosterone (ANDROGEL) 50 MG/5GM GEL, Place 5 g onto the skin daily., Disp: , Rfl: ;  UNABLE TO FIND, Med Name: ETDA.  Takes 18 drops daily, Disp: , Rfl: ;  valsartan-hydrochlorothiazide (DIOVAN-HCT) 320-25 MG per tablet, Take 1 tablet by mouth daily., Disp: , Rfl: ;  VITAMIN D, ERGOCALCIFEROL, PO, Take 800 Units by mouth daily., Disp: , Rfl: ;  vitamin E 400 UNIT capsule, Take 800 Units by mouth daily., Disp: , Rfl:    Allergies:  Allergies   Allergen  Reactions   .  Aspirin  Other (See Comments)       GI bleed   .  Ciprofloxacin  Rash       All over the body   .  Levothyroxine Sodium  Rash       All over the body      Social History: He is retired after working in [employer]. He lives with his wife in [place]. He does not use tobacco or alcohol. He received a red cell transfusion when he had a GI bleed in the 1990s. He was in the [employer]	[place] from [phone]. No risk factors for HIV or hepatitis.        History   Alcohol Use  No       History   Smoking status   .  Former Smoker -- 4.0 packs/day for 5 years   .  Types:  Cigarettes   .  Quit date:  01/06/1962   Smokeless tobacco   .  Never Used        ROS:    Positives include: 30 pound weight loss prior to surgery, mild droop of the  right eyes since surgery.   A complete ROS was otherwise negative. Including no fever, flushing, or diarrhea prior to surgery   Physical Exam:   Blood pressure 149/83, pulse 74, temperature 97 F (36.1 C), temperature source Oral, height 5\' 9"  (1.753 m), weight 166 lb 12.8 oz (75.66 kg).   HEENT: Oropharynx without visible mass, neck without mass Lungs: Lungs clear bilaterally Cardiac: Regular rate and rhythm Abdomen: Healed midline incision, no hepatosplenomegaly, no mass GU: ? Left hydrocele, testes without mass, uncircumcised   Vascular: No leg edema Lymph nodes: No cervical, supraclavicular, axillary, or inguinal nodes Neurologic: Alert and oriented, motor exam appears grossly intact Skin: No rash       LAB:   CBC    Lab Results   Component  Value  Date     WBC  11.8*  02/18/2012     HGB  13.0  02/18/2012     HCT  38.4*  02/18/2012     MCV  92.1  02/18/2012     PLT  180  02/18/2012      CMP                 Component  Value  Date/Time     NA  141  02/18/2012 0650     K  5.0  02/18/2012 0650     CL  108  02/18/2012 0650     CO2  25  02/18/2012 0650     GLUCOSE  98  02/18/2012 0650     BUN  15  02/18/2012 0650     CREATININE  1.21  02/18/2012 0650     CALCIUM  8.1*  02/18/2012 0650     PROT  6.5  02/09/2012 1347     ALBUMIN  3.4*  02/09/2012 1347     AST  20  02/09/2012 1347     ALT  21  02/09/2012 1347     ALKPHOS  120*  02/09/2012 1347     BILITOT  0.4  02/09/2012 1347     GFRNONAA  56*  02/18/2012 0650     GFRAA  65*  02/18/2012 0650    Chromogranin A- 54 on 01/07/2012 24-hour urine 5 HIAA-14.8 on 01/07/2012   Radiology: As per history of present illness     Assessment/Plan:    1. Low grade carcinoid tumor(s) of the small bowel (T4 N1), status post a small bowel resection 02/17/2012 confirming multiple carcinoid tumors involving a segment of small bowel with lymphovascular/perineural invasion, and a question of residual disease at the small bowel  mesentery   2. History of a partial small bowel obstruction secondary to the small bowel carcinoid tumor   3. Elevated preoperative chromogranin A and 24-hour urine 5 HIAA levels  4.  Small lung nodules seen on the CT of the abdomen 12/23/2011-likely benign     Disposition:     He has undergone a small bowel resection for treatment of a bowel obstruction caused by carcinoid tumors of the small bowel. I discussed the diagnosis and treatment options with the patient and his wife. I explained there is no role for "adjuvant" therapy in patients with resected carcinoid tumors. He had a low-grade tumor that appears to have  been present for several years prior to undergoing surgical resection. However he does have a significant risk of developing recurrent disease based on the clinical findings at the time of surgery and the surgical pathology.   He will be scheduled for a postoperative baseline 24 hour urine 5 HIAA and chromogranin A level within the next few weeks. He will return for an office visit and repeat laboratory studies in 8 months.       Terryl Niziolek 03/21/2012, 5:27 PM

## 2012-03-28 ENCOUNTER — Telehealth: Payer: Self-pay | Admitting: *Deleted

## 2012-03-28 ENCOUNTER — Encounter (INDEPENDENT_AMBULATORY_CARE_PROVIDER_SITE_OTHER): Payer: Self-pay

## 2012-03-28 ENCOUNTER — Other Ambulatory Visit: Payer: Medicare HMO | Admitting: Lab

## 2012-03-28 NOTE — Telephone Encounter (Signed)
Received call from patient stating he did not understand that he was supposed to come in for labs today.  He needs to be re-scheduled.  Will contact the scheduler to call patient with a new appointment date. Encouraged patient to call back with any questions.

## 2012-04-04 ENCOUNTER — Encounter (INDEPENDENT_AMBULATORY_CARE_PROVIDER_SITE_OTHER): Payer: Self-pay | Admitting: General Surgery

## 2012-04-04 ENCOUNTER — Ambulatory Visit (INDEPENDENT_AMBULATORY_CARE_PROVIDER_SITE_OTHER): Payer: Medicare HMO | Admitting: General Surgery

## 2012-04-04 VITALS — BP 138/68 | HR 74 | Temp 98.8°F | Resp 16 | Ht 69.0 in | Wt 171.4 lb

## 2012-04-04 DIAGNOSIS — C7A011 Malignant carcinoid tumor of the jejunum: Secondary | ICD-10-CM

## 2012-04-04 NOTE — Patient Instructions (Signed)
Your abdominal incision has healed well. You may continue all physical activities that you were capable of before  without any restriction.  Keep your regular appointments with Dr. Truett Perna for lab and urine tests.  Return to see Dr. Derrell Lolling in one year.

## 2012-04-04 NOTE — Progress Notes (Signed)
Subjective:     Patient ID: Matthew Brennan, male   DOB: May 30, 1932, 78 y.o.   MRN: [id number]  HPI This 77 year old gentleman underwent exploratory laparotomy, resection of 5 feet of small bowel at with pathologic findings of multiple partially instructing carcinoid tumors. 11/20 lymph nodes were positive for metastatic carcinoid. The liver and omentum looked clean.  He feels much better now. He is back to full activities and lifting heavy objects  around the house and with his volunteer activities. His appetite is normal. His bowel movements are normal. He feels much better and has no obstructive symptoms.  He saw Dr. Mancel Bale. Dr.  Truett Perna plans to get baseline urine and blood tests for carcinoid tumor markers. He is going to followup with the patient in 7 or 8 months.  Review of Systems     Objective:   Physical Exam Patient looks well. He is in no distress. His wife is with him.  Lungs clear to auscultation bilaterally  Soft and nontender. Midline incision well healed. No hernia.   Assessment:     Multiple carcinoid tumors of the jejunum, partially obstructing, he has recovered uneventfully following resection and primary anastomosis.  No evidence of metastatic disease, but urine and blood tests were slightly elevated preop.      Plan:   Baseline Chromagranin A and 24-hour urine for HIAA are being obtained by Dr. Truett Perna.  Okay to continue all normal physical activities without restriction  Followup with Dr. Truett Perna in 7 or 8 months  Return  to see me in one year.    Angelia Mould. Derrell Lolling, M.D., [facility] General and Minimally invasive Surgery Breast and Colorectal Surgery Office:   [phone] Pager:   [phone]

## 2012-04-06 ENCOUNTER — Other Ambulatory Visit (HOSPITAL_BASED_OUTPATIENT_CLINIC_OR_DEPARTMENT_OTHER): Payer: Medicare HMO | Admitting: Lab

## 2012-04-06 DIAGNOSIS — C7A011 Malignant carcinoid tumor of the jejunum: Secondary | ICD-10-CM

## 2012-04-12 LAB — CHROMOGRANIN A: Chromogranin A: 16 ng/mL — ABNORMAL HIGH (ref 1.9–15.0)

## 2012-06-07 ENCOUNTER — Encounter (INDEPENDENT_AMBULATORY_CARE_PROVIDER_SITE_OTHER): Payer: Self-pay

## 2012-11-29 ENCOUNTER — Other Ambulatory Visit (HOSPITAL_BASED_OUTPATIENT_CLINIC_OR_DEPARTMENT_OTHER): Payer: Medicare HMO | Admitting: Lab

## 2012-11-29 DIAGNOSIS — C7A011 Malignant carcinoid tumor of the jejunum: Secondary | ICD-10-CM

## 2012-12-05 ENCOUNTER — Telehealth: Payer: Self-pay | Admitting: Oncology

## 2012-12-05 ENCOUNTER — Ambulatory Visit (HOSPITAL_BASED_OUTPATIENT_CLINIC_OR_DEPARTMENT_OTHER): Payer: Medicare HMO | Admitting: Oncology

## 2012-12-05 VITALS — BP 118/67 | HR 78 | Temp 97.2°F | Resp 18 | Ht 69.0 in | Wt 175.7 lb

## 2012-12-05 DIAGNOSIS — D3A00 Benign carcinoid tumor of unspecified site: Secondary | ICD-10-CM

## 2012-12-05 DIAGNOSIS — C7A011 Malignant carcinoid tumor of the jejunum: Secondary | ICD-10-CM

## 2012-12-05 DIAGNOSIS — R918 Other nonspecific abnormal finding of lung field: Secondary | ICD-10-CM

## 2012-12-05 NOTE — Telephone Encounter (Signed)
gv and printed appt schedule for pt for Oct..the patient aware

## 2012-12-05 NOTE — Progress Notes (Signed)
   [facility]    OFFICE PROGRESS NOTE   INTERVAL HISTORY:   He returns as scheduled. He feels well. No complaint. He denies flushing and diarrhea.  Objective:  Vital signs in last 24 hours:  Blood pressure 118/67, pulse 78, temperature 97.2 F (36.2 C), temperature source Oral, resp. rate 18, height 5\' 9"  (1.753 m), weight 175 lb 11.2 oz (79.697 kg).    HEENT: Neck without mass Lymphatics: No cervical, supraclavicular, axillary, or inguinal nodes Resp: Lungs clear bilaterally Cardio: Regular rate and rhythm, 2/6 systolic murmur GI: No hepatosplenomegaly, nontender, no mass Vascular: No leg edema   Lab Results:  24-hour urine 5 HIAA and chromogranin A level pending Chromogranin A on 04/06/2012-16   Medications: I have reviewed the patient's current medications.  Assessment/Plan: 1. 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 History of a partial small bowel obstruction secondary to the small bowel carcinoid tumor  3. Elevated preoperative chromogranin A and 24-hour urine 5 HIAA levels  4. Small lung nodules seen on the CT of the abdomen 12/23/2011-likely benign   Disposition:  He remains in clinical remission from the carcinoid tumor. We will followup on the 24-hour urine 5 HIAA and chromogranin A levels from today. He will return for an office and lab visit in 9 months.   Thornton Papas, MD  12/05/2012  3:27 PM

## 2012-12-06 LAB — CHROMOGRANIN A: Chromogranin A: 24 ng/mL — ABNORMAL HIGH (ref 1.9–15.0)

## 2012-12-06 LAB — 5 HIAA, QUANTITATIVE, URINE, 24 HOUR
5-HIAA, 24 Hr Urine: 5.8 mg/24 h (ref <=6.0)
Volume, Urine-5HIAA: 1200 mL/24 h

## 2012-12-07 ENCOUNTER — Telehealth: Payer: Self-pay | Admitting: *Deleted

## 2012-12-07 NOTE — Telephone Encounter (Signed)
Message copied by Caleb Popp on Wed Dec 07, 2012  8:35 AM ------      Message from: Ladene Artist      Created: Tue Dec 06, 2012  9:23 PM       Please call patient, labs are ok, mild elevation of chromagrannin A, repeat as scheduled

## 2012-12-07 NOTE — Telephone Encounter (Signed)
Called pt with lab results. He voiced understanding.

## 2013-03-17 ENCOUNTER — Ambulatory Visit (INDEPENDENT_AMBULATORY_CARE_PROVIDER_SITE_OTHER): Payer: Medicare HMO | Admitting: General Surgery

## 2013-03-17 ENCOUNTER — Encounter (INDEPENDENT_AMBULATORY_CARE_PROVIDER_SITE_OTHER): Payer: Self-pay | Admitting: General Surgery

## 2013-03-17 VITALS — BP 140/82 | HR 60 | Temp 97.0°F | Resp 12 | Ht 69.0 in | Wt 174.0 lb

## 2013-03-17 DIAGNOSIS — C7A011 Malignant carcinoid tumor of the jejunum: Secondary | ICD-10-CM

## 2013-03-17 NOTE — Patient Instructions (Signed)
You appear to be doing well one year after your small bowel resection for multiple carcinoid tumors.  All of your incisions have healed well without any complication or hernia.  Keep your regularly scheduled appointments with Dr. Truett Perna. He will decide when you need blood work, urine test, and x-rays.  You should get a colonoscopy every 5 years.  Call Dr. Truett Perna or me if you  develop any diarrhea, excessive sweating or flushing.  Otherwise, return to see Dr. Derrell Lolling if further problems arise.

## 2013-03-17 NOTE — Progress Notes (Signed)
Patient ID: Matthew Brennan, male   DOB: 04/14/1932, 79 y.o.   MRN: [id number]  Chief Complaint  Patient presents with  . Follow-up    HPI Matthew Brennan is a 79 y.o. male.  He returns to see me for a long-term followup regarding his small bowel carcinoid tumors.  This 79 year old gentleman underwent exploratory laparotomy, resection of 5 feet of small bowel at with pathologic findings of multiple partially instructing carcinoid tumors. 11/20 lymph nodes were positive for metastatic carcinoid. The liver and omentum looked clean. There was concern for residual disease deep within the root of the small bowel mesentery. He feels much better now. He is back to full activities and lifting heavy objects around the house and with his volunteer activities. His appetite is normal. His bowel movements are normal. He feels much better and has no obstructive symptoms. His only gastrointestinal complaint is an increase in flatus. He chews a lot of gum. He has no wound problems. He is followed closely by Dr. Truett Perna. Dr. Truett Perna checks his chromogranin A and urine HIAA periodically.   HPI  Past Medical History  Diagnosis Date  . Mitral valve prolapse     last visit w/ cardiologist 1999 in [place]  . Obesity   . Testosterone deficiency   . ED (erectile dysfunction)   . Diverticulitis   . Hx SBO   . CAD (coronary artery disease)   . Abdominal distension   . Abdominal pain   . Diverticulosis of colon without diverticulitis   . Dysrhythmia     palpitations from MVP occ  . Pneumonia     as a child  . Enlarged prostate with lower urinary tract symptoms (LUTS)   . Carcinoid tumor of intestine   . Recurrent upper respiratory infection (URI)   . 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 Blood transfusion 1992    s/p GI bleed  . Heart murmur   . Hypertension   . Thyroid disease     hypothyroidism  . Hypothyroidism     Past Surgical History  Procedure Laterality Date  . No  prior surgeries    . Eye surgery  2000,2003    cataract ext/iol bilateral  . No past surgeries    . Mouth surgery  4098.1191 2004  . Exploritory laperotmy with small bowel resection  02/17/2012  . Laparotomy  02/17/2012    Procedure: EXPLORATORY LAPAROTOMY;  Surgeon: Ernestene Mention, MD;  Location: [facility] OR;  Service: General;  Laterality: N/A;  . Bowel resection  02/17/2012    Procedure: SMALL BOWEL RESECTION;  Surgeon: Ernestene Mention, MD;  Location: [facility] OR;  Service: General;;  small bowel resection and wedge resection small  bowel mass.    Family History  Problem Relation Age of Onset  . Kidney disease Father   . Colon cancer Neg Hx   . Stomach cancer Neg Hx   . Anesthesia problems Neg Hx     Social History History  Substance Use Topics  . Smoking status: Former Smoker -- 4.00 packs/day for 5 years    Types: Cigarettes    Quit date: 01/06/1962  . Smokeless tobacco: Never Used  . Alcohol Use: No    Allergies  Allergen Reactions  . Aspirin Other (See Comments)    GI bleed  . Ciprofloxacin Rash    All over the body  . Levothyroxine Sodium Rash    All over the body    Current Outpatient Prescriptions  Medication  Sig Dispense Refill  . Ascorbic Acid (VITAMIN C) 1000 MG tablet Take 3,000 mg by mouth daily.       . Multiple Vitamins-Minerals (MULTIVITAL PO) Take 1 capsule by mouth daily. One-A-Day      . PRESCRIPTION MEDICATION Take 2 capsules by mouth daily. Liothyronine 5.1 mcg/Levothyroxine 16.8 mcg/Selenuim 0.11 mg/Zinc 50 mg. Prescription from [facility]  [phone]      . testosterone (ANDROGEL) 50 MG/5GM GEL Place 5 g onto the skin daily.      . valsartan-hydrochlorothiazide (DIOVAN-HCT) 320-25 MG per tablet Take 1 tablet by mouth daily.      Marland Kitchen VITAMIN D, ERGOCALCIFEROL, PO Take 800 Units by mouth daily.      . vitamin E 400 UNIT capsule Take 800 Units by mouth daily.       No current facility-administered medications for this visit.    Review of  Systems Review of Systems  Constitutional: Negative for fever, chills and unexpected weight change.  HENT: Negative for hearing loss, congestion, sore throat, trouble swallowing and voice change.   Eyes: Negative for visual disturbance.  Respiratory: Negative for cough and wheezing.   Cardiovascular: Negative for chest pain, palpitations and leg swelling.  Gastrointestinal: Negative for nausea, vomiting, abdominal pain, diarrhea, constipation, blood in stool, abdominal distention, anal bleeding and rectal pain.  Genitourinary: Negative for hematuria and difficulty urinating.  Musculoskeletal: Negative for arthralgias.  Skin: Negative for rash and wound.  Neurological: Negative for seizures, syncope, weakness and headaches.  Hematological: Negative for adenopathy. Does not bruise/bleed easily.  Psychiatric/Behavioral: Negative for confusion.    Blood pressure 140/82, pulse 60, temperature 97 F (36.1 C), temperature source Oral, resp. rate 12, height 5\' 9"  (1.753 m), weight 174 lb (78.926 kg).  Physical Exam Physical Exam  Constitutional: He is oriented to person, place, and time. He appears well-developed and well-nourished. No distress.  HENT:  Head: Normocephalic.  Eyes: Conjunctivae and EOM are normal. Pupils are equal, round, and reactive to light. Right eye exhibits no discharge. Left eye exhibits no discharge. No scleral icterus.  Neck: Normal range of motion. Neck supple. No JVD present. No tracheal deviation present. No thyromegaly present.  Cardiovascular: Normal rate, regular rhythm, normal heart sounds and intact distal pulses.   No murmur heard. Pulmonary/Chest: Effort normal and breath sounds normal. No stridor. No respiratory distress. He has no wheezes. He has no rales. He exhibits no tenderness.  Abdominal: Soft. Bowel sounds are normal. He exhibits no distension and no mass. There is no tenderness. There is no rebound and no guarding.  Midline scar well healed. No  hernia. No mass. No inguinal adenopathy. Liver not palpably enlarged.  Musculoskeletal: Normal range of motion. He exhibits no edema and no tenderness.  Lymphadenopathy:    He has no cervical adenopathy.  Neurological: He is alert and oriented to person, place, and time. He has normal reflexes. Coordination normal.  Skin: Skin is warm and dry. No rash noted. He is not diaphoretic. No erythema. No pallor.  Psychiatric: He has a normal mood and affect. His behavior is normal. Judgment and thought content normal.    Data Reviewed All of my records and records from the [facility] cancer center.  Assessment    Multiple carcinoid tumors of the jejunum, partially obstructing. Status post laparotomy and resection of 5 feet of small bowel with primary anastomosis. No direct surgical complications to date  Question of residual disease in the root of the small bowel mesentery.  There are  no symptoms of carcinoid syndrome syndrome at this time    Plan    I advised the patient to keep his regular appointment with Dr. Truett Perna. Dr. Truett Perna would decide about blood test, urine tests and x-ray tests from time to time  I told him to call us if he develops diarrhea, excessive swelling or flushing  He was advised to her colonoscopy every 5 years  Return to see me if further problems arise.       Angelia Mould. Derrell Lolling, M.D., [facility] General and Minimally invasive Surgery Breast and Colorectal Surgery Office:   [phone] Pager:   [phone]  03/17/2013, 2:02 PM

## 2013-07-05 ENCOUNTER — Inpatient Hospital Stay (HOSPITAL_COMMUNITY)
Admission: EM | Admit: 2013-07-05 | Discharge: 2013-07-07 | DRG: 249 | Disposition: A | Discharge status: Home / Self Care | Payer: Medicare HMO | Attending: Cardiology | Admitting: Cardiology

## 2013-07-05 ENCOUNTER — Encounter (HOSPITAL_COMMUNITY): Payer: Self-pay | Admitting: Adult Health

## 2013-07-05 ENCOUNTER — Emergency Department (HOSPITAL_COMMUNITY): Payer: Medicare HMO

## 2013-07-05 DIAGNOSIS — I129 Hypertensive chronic kidney disease with stage 1 through stage 4 chronic kidney disease, or unspecified chronic kidney disease: Secondary | ICD-10-CM | POA: Diagnosis present

## 2013-07-05 DIAGNOSIS — K5730 Diverticulosis of large intestine without perforation or abscess without bleeding: Secondary | ICD-10-CM | POA: Diagnosis present

## 2013-07-05 DIAGNOSIS — E785 Hyperlipidemia, unspecified: Secondary | ICD-10-CM | POA: Diagnosis present

## 2013-07-05 DIAGNOSIS — I200 Unstable angina: Secondary | ICD-10-CM | POA: Diagnosis present

## 2013-07-05 DIAGNOSIS — R079 Chest pain, unspecified: Secondary | ICD-10-CM

## 2013-07-05 DIAGNOSIS — Z859 Personal history of malignant neoplasm, unspecified: Secondary | ICD-10-CM

## 2013-07-05 DIAGNOSIS — E039 Hypothyroidism, unspecified: Secondary | ICD-10-CM | POA: Diagnosis present

## 2013-07-05 DIAGNOSIS — I059 Rheumatic mitral valve disease, unspecified: Secondary | ICD-10-CM | POA: Diagnosis present

## 2013-07-05 DIAGNOSIS — N189 Chronic kidney disease, unspecified: Secondary | ICD-10-CM | POA: Diagnosis present

## 2013-07-05 DIAGNOSIS — E669 Obesity, unspecified: Secondary | ICD-10-CM | POA: Diagnosis present

## 2013-07-05 DIAGNOSIS — I951 Orthostatic hypotension: Secondary | ICD-10-CM | POA: Diagnosis present

## 2013-07-05 DIAGNOSIS — I2510 Atherosclerotic heart disease of native coronary artery without angina pectoris: Principal | ICD-10-CM | POA: Diagnosis present

## 2013-07-05 LAB — BASIC METABOLIC PANEL
BUN: 31 mg/dL — ABNORMAL HIGH (ref 6–23)
CO2: 25 mEq/L (ref 19–32)
Calcium: 9.2 mg/dL (ref 8.4–10.5)
Chloride: 102 mEq/L (ref 96–112)
Creatinine, Ser: 1.5 mg/dL — ABNORMAL HIGH (ref 0.50–1.35)
GFR calc Af Amer: 50 mL/min — ABNORMAL LOW (ref >90)
GFR calc non Af Amer: 43 mL/min — ABNORMAL LOW (ref >90)
Glucose, Bld: 101 mg/dL — ABNORMAL HIGH (ref 70–99)
Potassium: 4.1 mEq/L (ref 3.5–5.1)
Sodium: 138 mEq/L (ref 135–145)

## 2013-07-05 LAB — CBC
HCT: 38.7 % — ABNORMAL LOW (ref 39.0–52.0)
Hemoglobin: 14 g/dL (ref 13.0–17.0)
MCH: 32.5 pg (ref 26.0–34.0)
MCHC: 36.2 g/dL — ABNORMAL HIGH (ref 30.0–36.0)
MCV: 89.8 fL (ref 78.0–100.0)
Platelets: 204 10*3/uL (ref 150–400)
RBC: 4.31 MIL/uL (ref 4.22–5.81)
RDW: 13.1 % (ref 11.5–15.5)
WBC: 7.9 10*3/uL (ref 4.0–10.5)

## 2013-07-05 LAB — POCT I-STAT TROPONIN I: Troponin i, poc: 0 ng/mL (ref 0.00–0.08)

## 2013-07-05 MED ORDER — ASPIRIN 81 MG PO CHEW
324.0000 mg | CHEWABLE_TABLET | Freq: Once | ORAL | Status: AC
  Administered 2013-07-05: 324 mg via ORAL
  Filled 2013-07-05: qty 4

## 2013-07-05 NOTE — ED Notes (Addendum)
Presents with tightness in bilateral axilla that radiates to center chest and bilateral arms began at 19:30 today. Described as "feels like someone had a strap around me" dnenies nausea, reports SOB. Nothing makes pain better, exercise makes pain worse. Pain was constant for 20 minutes, now has subsided.  Reports diaphoresis.

## 2013-07-05 NOTE — ED Notes (Signed)
NURSE FIRST ROUNDS : NURSE UPDATED PT. ON WAIT TIME , EXPLAINED DELAY AND PROCESS , ALERT , RESPIRATIONS UNLBAORE D/ NO PAIN AT THIS TIME .

## 2013-07-06 ENCOUNTER — Encounter (HOSPITAL_COMMUNITY)
Admission: EM | Disposition: A | Discharge status: Home / Self Care | Payer: Self-pay | Source: Home / Self Care | Attending: Cardiology

## 2013-07-06 HISTORY — PX: LEFT HEART CATHETERIZATION WITH CORONARY ANGIOGRAM: SHX5451

## 2013-07-06 LAB — CBC
HCT: 36 % — ABNORMAL LOW (ref 39.0–52.0)
Hemoglobin: 12.4 g/dL — ABNORMAL LOW (ref 13.0–17.0)
MCH: 31.1 pg (ref 26.0–34.0)
MCHC: 34.4 g/dL (ref 30.0–36.0)
MCV: 90.2 fL (ref 78.0–100.0)
Platelets: 162 10*3/uL (ref 150–400)
RBC: 3.99 MIL/uL — ABNORMAL LOW (ref 4.22–5.81)
RDW: 13.1 % (ref 11.5–15.5)
WBC: 5.9 10*3/uL (ref 4.0–10.5)

## 2013-07-06 LAB — TROPONIN I: Troponin I: <0.3 ng/mL (ref <0.30)

## 2013-07-06 LAB — POCT ACTIVATED CLOTTING TIME
Activated Clotting Time: 452 seconds
Activated Clotting Time: 457 seconds

## 2013-07-06 LAB — HEPARIN LEVEL (UNFRACTIONATED): Heparin Unfractionated: 0.39 IU/mL (ref 0.30–0.70)

## 2013-07-06 SURGERY — LEFT HEART CATHETERIZATION WITH CORONARY ANGIOGRAM
Anesthesia: LOCAL

## 2013-07-06 MED ORDER — VITAMIN C 500 MG PO TABS
3000.0000 mg | ORAL_TABLET | Freq: Every day | ORAL | Status: DC
  Administered 2013-07-06 – 2013-07-07 (×2): 3000 mg via ORAL
  Filled 2013-07-06 (×3): qty 6

## 2013-07-06 MED ORDER — METOPROLOL TARTRATE 25 MG PO TABS
25.0000 mg | ORAL_TABLET | Freq: Two times a day (BID) | ORAL | Status: DC
  Administered 2013-07-06 – 2013-07-07 (×3): 25 mg via ORAL
  Filled 2013-07-06 (×5): qty 1

## 2013-07-06 MED ORDER — TICAGRELOR 90 MG PO TABS
ORAL_TABLET | ORAL | Status: AC
  Filled 2013-07-06: qty 2

## 2013-07-06 MED ORDER — ATORVASTATIN CALCIUM 10 MG PO TABS
10.0000 mg | ORAL_TABLET | Freq: Every day | ORAL | Status: DC
  Administered 2013-07-06: 19:00:00 10 mg via ORAL
  Filled 2013-07-06 (×2): qty 1

## 2013-07-06 MED ORDER — LIDOCAINE HCL (PF) 1 % IJ SOLN
INTRAMUSCULAR | Status: AC
  Filled 2013-07-06: qty 30

## 2013-07-06 MED ORDER — ONDANSETRON HCL 4 MG/2ML IJ SOLN: 4.0000 mg | Freq: Four times a day (QID) | INTRAMUSCULAR | Status: DC | PRN

## 2013-07-06 MED ORDER — HEPARIN BOLUS VIA INFUSION
4000.0000 [IU] | Freq: Once | INTRAVENOUS | Status: AC
  Administered 2013-07-06: 4000 [IU] via INTRAVENOUS

## 2013-07-06 MED ORDER — HEPARIN (PORCINE) IN NACL 2-0.9 UNIT/ML-% IJ SOLN
INTRAMUSCULAR | Status: AC
  Filled 2013-07-06: qty 1000

## 2013-07-06 MED ORDER — TESTOSTERONE 50 MG/5GM (1%) TD GEL
5.0000 g | Freq: Every day | TRANSDERMAL | Status: DC
  Administered 2013-07-06: 17:00:00 5 g via TRANSDERMAL
  Filled 2013-07-06: qty 5

## 2013-07-06 MED ORDER — MIDAZOLAM HCL 2 MG/2ML IJ SOLN
INTRAMUSCULAR | Status: AC
  Filled 2013-07-06: qty 2

## 2013-07-06 MED ORDER — SODIUM CHLORIDE 0.9 % IV BOLUS (SEPSIS)
500.0000 mL | Freq: Once | INTRAVENOUS | Status: AC
  Administered 2013-07-06: 07:00:00 via INTRAVENOUS

## 2013-07-06 MED ORDER — ACETAMINOPHEN 325 MG PO TABS: 650.0000 mg | ORAL_TABLET | ORAL | Status: DC | PRN

## 2013-07-06 MED ORDER — DIPHENHYDRAMINE-APAP (SLEEP) 25-500 MG PO TABS: 1.0000 | ORAL_TABLET | Freq: Every day | ORAL | Status: DC

## 2013-07-06 MED ORDER — HEPARIN (PORCINE) IN NACL 100-0.45 UNIT/ML-% IJ SOLN
1100.0000 [IU]/h | INTRAMUSCULAR | Status: DC
  Administered 2013-07-06: 1100 [IU]/h via INTRAVENOUS
  Filled 2013-07-06: qty 250

## 2013-07-06 MED ORDER — HEPARIN SODIUM (PORCINE) 1000 UNIT/ML IJ SOLN
INTRAMUSCULAR | Status: AC
  Filled 2013-07-06: qty 1

## 2013-07-06 MED ORDER — ACETAMINOPHEN 500 MG PO TABS
500.0000 mg | ORAL_TABLET | Freq: Every day | ORAL | Status: DC
  Administered 2013-07-06: 21:00:00 500 mg via ORAL
  Filled 2013-07-06 (×2): qty 1

## 2013-07-06 MED ORDER — VITAMIN E 45 MG (100 UNIT) PO CAPS
1000.0000 [IU] | ORAL_CAPSULE | Freq: Every day | ORAL | Status: DC
  Administered 2013-07-06 – 2013-07-07 (×2): 1000 [IU] via ORAL
  Filled 2013-07-06 (×3): qty 2

## 2013-07-06 MED ORDER — MELATONIN 10 MG PO TABS: 10.0000 mg | ORAL_TABLET | Freq: Every day | ORAL | Status: DC

## 2013-07-06 MED ORDER — SODIUM CHLORIDE 0.9 % IV SOLN: 250.0000 mL | INTRAVENOUS | Status: DC | PRN

## 2013-07-06 MED ORDER — TICAGRELOR 90 MG PO TABS
90.0000 mg | ORAL_TABLET | Freq: Two times a day (BID) | ORAL | Status: DC
  Administered 2013-07-06 – 2013-07-07 (×2): 90 mg via ORAL
  Filled 2013-07-06 (×4): qty 1

## 2013-07-06 MED ORDER — HYDROCHLOROTHIAZIDE 12.5 MG PO CAPS
12.5000 mg | ORAL_CAPSULE | Freq: Every day | ORAL | Status: DC
  Administered 2013-07-06 – 2013-07-07 (×2): 12.5 mg via ORAL
  Filled 2013-07-06 (×3): qty 1

## 2013-07-06 MED ORDER — ASPIRIN 81 MG PO CHEW: 324.0000 mg | CHEWABLE_TABLET | ORAL | Status: DC

## 2013-07-06 MED ORDER — VALSARTAN-HYDROCHLOROTHIAZIDE 160-12.5 MG PO TABS: 1.0000 | ORAL_TABLET | Freq: Every day | ORAL | Status: DC

## 2013-07-06 MED ORDER — NITROGLYCERIN 0.2 MG/ML ON CALL CATH LAB
INTRAVENOUS | Status: AC
  Filled 2013-07-06: qty 1

## 2013-07-06 MED ORDER — VERAPAMIL HCL 2.5 MG/ML IV SOLN
INTRAVENOUS | Status: AC
  Filled 2013-07-06: qty 2

## 2013-07-06 MED ORDER — SODIUM CHLORIDE 0.9 % IV SOLN
1.0000 mL/kg/h | INTRAVENOUS | Status: AC
  Administered 2013-07-06: 1 mL/kg/h via INTRAVENOUS

## 2013-07-06 MED ORDER — SODIUM CHLORIDE 0.9 % IV SOLN
INTRAVENOUS | Status: DC
  Administered 2013-07-06: 06:00:00 via INTRAVENOUS

## 2013-07-06 MED ORDER — IRBESARTAN 150 MG PO TABS
150.0000 mg | ORAL_TABLET | Freq: Every day | ORAL | Status: DC
  Administered 2013-07-06 – 2013-07-07 (×2): 150 mg via ORAL
  Filled 2013-07-06 (×3): qty 1

## 2013-07-06 MED ORDER — SODIUM CHLORIDE 0.9 % IJ SOLN: 3.0000 mL | Freq: Two times a day (BID) | INTRAMUSCULAR | Status: DC

## 2013-07-06 MED ORDER — SODIUM CHLORIDE 0.9 % IJ SOLN: 3.0000 mL | INTRAMUSCULAR | Status: DC | PRN

## 2013-07-06 MED ORDER — BIVALIRUDIN 250 MG IV SOLR
INTRAVENOUS | Status: AC
  Filled 2013-07-06: qty 250

## 2013-07-06 MED ORDER — FENTANYL CITRATE 0.05 MG/ML IJ SOLN
INTRAMUSCULAR | Status: AC
  Filled 2013-07-06: qty 2

## 2013-07-06 MED ORDER — DIPHENHYDRAMINE HCL 25 MG PO CAPS
25.0000 mg | ORAL_CAPSULE | Freq: Every day | ORAL | Status: DC
  Administered 2013-07-06: 25 mg via ORAL
  Filled 2013-07-06 (×2): qty 1

## 2013-07-06 NOTE — Progress Notes (Signed)
Dr.Ganji notified of patient arrival to unit.New orders received.

## 2013-07-06 NOTE — Plan of Care (Signed)
Problem: Consults Goal: Cardiac Cath Patient Education (See Patient Education module for education specifics.)  Outcome: Completed/Met Date Met:  07/06/13 Video #115 shown     

## 2013-07-06 NOTE — Progress Notes (Signed)
ANTICOAGULATION CONSULT NOTE - Initial Consult  Pharmacy Consult for heparin  Indication: chest pain/ACS  Allergies  Allergen Reactions  . Aspirin Other (See Comments)    GI bleed  . Ciprofloxacin Rash    All over the body  . Levothyroxine Sodium Rash    All over the body    Patient Measurements: Height: 5\' 9"  (175.3 cm) Weight: 170 lb (77.111 kg) IBW/kg (Calculated) : 70.7 Heparin Dosing Weight:   Vital Signs: Temp: 97.9 F (36.6 C) (08/13 2241) Temp src: Oral (08/13 2241) BP: 108/81 mmHg (08/14 0015) Pulse Rate: 77 (08/14 0015)  Labs:  Recent Labs  07/05/13 2041  HGB 14.0  HCT 38.7*  PLT 204  CREATININE 1.50*    Estimated Creatinine Clearance: 40.6 ml/min (by C-G formula based on Cr of 1.5).   Medical History: Past Medical History  Diagnosis Date  . Mitral valve prolapse     last visit w/ cardiologist 1999 in [place]  . Obesity   . Testosterone deficiency   . ED (erectile dysfunction)   . Diverticulitis   . Hx SBO   . CAD (coronary artery disease)   . Abdominal distension   . Abdominal pain   . Diverticulosis of colon without diverticulitis   . Dysrhythmia     palpitations from MVP occ  . Pneumonia     as a child  . Enlarged prostate with lower urinary tract symptoms (LUTS)   . Carcinoid tumor of intestine   . Recurrent upper respiratory infection (URI)   . 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 Blood transfusion 1992    s/p GI bleed  . Heart murmur   . Hypertension   . Thyroid disease     hypothyroidism  . Hypothyroidism     Medications:   (Not in a hospital admission)  Assessment: 79 yo with ACS symptoms. Heparin per acs protocol. No anticoag meds PTA. Does take testosterone supplement.  Goal of Therapy:  Heparin level 0.3-0.7 units/ml Monitor platelets by anticoagulation protocol: Yes   Plan:  Heparin 4000 units x1 then 1100 units/hr. Check 8 hour HL then daily hl and cbc starting 8/15  Janice Coffin 07/06/2013,12:32 AM

## 2013-07-06 NOTE — H&P (Signed)
Ester Mabe Quiggle is an 79 y.o. male.   Chief Complaint: Chest pain HPI:  79 yo male presented to the ER from home with complaint of chest pain. Chest pain started tonight while walking his dog around 730 pm. Pain started in bilateral axillas and crossed across his chest in a tight band. He had diaphoresis, sob associated with this pain. Pt was nauseated. Sxs lasted about 45 minutes before resolving. No prior h/o same. 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 Status post laparotomy and resection of 5 feet of small bowel with primary anastomosis. Follows Dr. Truett Perna, [facility]. Minimal residual disease.He also has h/o orthostatic hypotension and supine hypertension, this has been ongoing for the past one year or more. He had been stable until recently patient has noticed much more marked episodes of near syncope. He has not been feeling well recently. Otherwise no other specific complaints. The main issue has been when they have been traveling outside the house they have had much more severe episodes, to a point that they have been concerned about stepping outside. No headache, no visual disturbances, no TIA like symptoms. No focal neurologic deficits. He denies any chest pain, shortness of breath, palpitations. No recent weight changes. No bowel or bladder disturbances. No further chest pain since being on IV heparin. Although PMH lists CAD, there is no h/o same in my chart. Past Medical History  Diagnosis Date  . Mitral valve prolapse     last visit w/ cardiologist 1999 in [place]  . Obesity   . Testosterone deficiency   . ED (erectile dysfunction)   . Diverticulitis   . Hx SBO   . CAD (coronary artery disease)   . Abdominal distension   . Abdominal pain   . Diverticulosis of colon without diverticulitis   . Dysrhythmia     palpitations from MVP occ  . Pneumonia     as a child  . Enlarged prostate with lower urinary tract symptoms  (LUTS)   . Carcinoid tumor of intestine   . Recurrent upper respiratory infection (URI)   . 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 Blood transfusion 1992    s/p GI bleed  . Heart murmur   . Hypertension   . Thyroid disease     hypothyroidism  . Hypothyroidism     Past Surgical History  Procedure Laterality Date  . No prior surgeries    . Eye surgery  2000,2003    cataract ext/iol bilateral  . No past surgeries    . Mouth surgery  [id number] 2004  . Exploritory laperotmy with small bowel resection  02/17/2012  . Laparotomy  02/17/2012    Procedure: EXPLORATORY LAPAROTOMY;  Surgeon: Ernestene Mention, MD;  Location: [facility] OR;  Service: General;  Laterality: N/A;  . Bowel resection  02/17/2012    Procedure: SMALL BOWEL RESECTION;  Surgeon: Ernestene Mention, MD;  Location: [facility] OR;  Service: General;;  small bowel resection and wedge resection small  bowel mass.    Family History  Problem Relation Age of Onset  . Kidney disease Father   . Colon cancer Neg Hx   . Stomach cancer Neg Hx   . Anesthesia problems Neg Hx    Social History:  reports that he quit smoking about 51 years ago. His smoking use included Cigarettes. He has a 20 pack-year smoking history. He has never used smokeless tobacco. He reports that he does not drink alcohol or use illicit  drugs.  Allergies:  Allergies  Allergen Reactions  . Aspirin Other (See Comments)    GI bleed  . Ciprofloxacin Rash    All over the body  . Levothyroxine Sodium Rash    All over the body    Medications Prior to Admission  Medication Sig Dispense Refill  . Ascorbic Acid (VITAMIN C) 1000 MG tablet Take 3,000 mg by mouth daily.       . diphenhydramine-acetaminophen (TYLENOL PM) 25-500 MG TABS Take 1 tablet by mouth at bedtime.      . Melatonin 10 MG TABS Take 10 mg by mouth daily.      . Multiple Vitamins-Minerals (MULTIVITAL PO) Take 1 capsule by mouth daily. One-A-Day      . testosterone (ANDROGEL) 50 MG/5GM GEL  Place 5 g onto the skin daily.      . valsartan-hydrochlorothiazide (DIOVAN-HCT) 160-12.5 MG per tablet Take 1 tablet by mouth daily.      . vitamin E 1000 UNIT capsule Take 1,000 Units by mouth daily.          Review of Systems -   Blood pressure 146/69, pulse 72, temperature 97.5 F (36.4 C), temperature source Oral, resp. rate 18, height 5\' 9"  (1.753 m), weight 75.5 kg (166 lb 7.2 oz), SpO2 99.00%. Moderately built and normal body habitus who is in no acute distress. Appears stated age. Alert Ox3.   There is no cyanosis. HEENT: normal limits. PERRLA, No JVD.   CARDIAC EXAM: S1, S2 normal, no gallop present. II-III/VI SEM in the RSB.  CHEST EXAM: No tenderness of chest wall. LUNGS: Clear to percuss and auscultate.  ABDOMEN: No hepatosplenomegaly. BS normal in all 4 quadrants. Abdomen is non-tender.   EXTREMITY: Full range of movementes, No edema. MUSCULOSKELETAL EXAM: Intact with full range of motion in all 4 extremities.   NEUROLOGIC EXAM: Grossly intact without any focal deficits. Alert O x 3.   VASCULAR EXAM: No skin breakdown. Carotids normal. Extremities: Femoral pulse normal. Popliteal pulse normal ; Pedal pulse normal. Otherwise No prominent pulse felt in the abdomen. No varicose veins.  Results for orders placed during the hospital encounter of 07/05/13 (from the past 48 hour(s))  CBC     Status: Abnormal   Collection Time    07/05/13  8:41 PM      Result Value Range   WBC 7.9  4.0 - 10.5 K/uL   RBC 4.31  4.22 - 5.81 MIL/uL   Hemoglobin 14.0  13.0 - 17.0 g/dL   HCT 91.4 (*) 78.2 - 95.6 %   MCV 89.8  78.0 - 100.0 fL   MCH 32.5  26.0 - 34.0 pg   MCHC 36.2 (*) 30.0 - 36.0 g/dL   RDW 21.3  08.6 - 57.8 %   Platelets 204  150 - 400 K/uL  BASIC METABOLIC PANEL     Status: Abnormal   Collection Time    07/05/13  8:41 PM      Result Value Range   Sodium 138  135 - 145 mEq/L   Potassium 4.1  3.5 - 5.1 mEq/L   Chloride 102  96 - 112 mEq/L   CO2 25  19 - 32  mEq/L   Glucose, Bld 101 (*) 70 - 99 mg/dL   BUN 31 (*) 6 - 23 mg/dL   Creatinine, Ser 4.69 (*) 0.50 - 1.35 mg/dL   Calcium 9.2  8.4 - 62.9 mg/dL   GFR calc non Af Amer 43 (*) >90 mL/min   GFR  calc Af Amer 50 (*) >90 mL/min   Comment: (NOTE)     The eGFR has been calculated using the CKD EPI equation.     This calculation has not been validated in all clinical situations.     eGFR's persistently <90 mL/min signify possible Chronic Kidney     Disease.  POCT I-STAT TROPONIN I     Status: None   Collection Time    07/05/13  9:02 PM      Result Value Range   Troponin i, poc 0.00  0.00 - 0.08 ng/mL   Comment 3            Comment: Due to the release kinetics of cTnI,     a negative result within the first hours     of the onset of symptoms does not rule out     myocardial infarction with certainty.     If myocardial infarction is still suspected,     repeat the test at appropriate intervals.  TROPONIN I     Status: None   Collection Time    07/06/13 12:08 AM      Result Value Range   Troponin I <0.30  <0.30 ng/mL   Comment:            Due to the release kinetics of cTnI,     a negative result within the first hours     of the onset of symptoms does not rule out     myocardial infarction with certainty.     If myocardial infarction is still suspected,     repeat the test at appropriate intervals.  CBC     Status: Abnormal   Collection Time    07/06/13  5:13 AM      Result Value Range   WBC 5.9  4.0 - 10.5 K/uL   RBC 3.99 (*) 4.22 - 5.81 MIL/uL   Hemoglobin 12.4 (*) 13.0 - 17.0 g/dL   HCT 16.1 (*) 09.6 - 04.5 %   MCV 90.2  78.0 - 100.0 fL   MCH 31.1  26.0 - 34.0 pg   MCHC 34.4  30.0 - 36.0 g/dL   RDW 40.9  81.1 - 91.4 %   Platelets 162  150 - 400 K/uL   Dg Chest 2 View  07/05/2013   *RADIOLOGY REPORT*  Clinical Data: Chest pain  CHEST - 2 VIEW  Comparison: 02/09/2012  Findings: Cardiomediastinal silhouette is within normal limits. The lungs are clear. No pleural effusion.   No pneumothorax.  No acute osseous abnormality.  IMPRESSION: Normal chest.   Original Report Authenticated By: Christiana Pellant, M.D.    Labs:   Lab Results  Component Value Date   WBC 5.9 07/06/2013   HGB 12.4* 07/06/2013   HCT 36.0* 07/06/2013   MCV 90.2 07/06/2013   PLT 162 07/06/2013    Recent Labs Lab 07/05/13 2041  NA 138  K 4.1  CL 102  CO2 25  BUN 31*  CREATININE 1.50*  CALCIUM 9.2  GLUCOSE 101*   Lab Results  Component Value Date   TROPONINI <0.30 07/06/2013   EKG: NSR, LAD, LAFB, LVH, PVC, Non specific T change. No ischemia. Office Echo 02/16/12: Normal LVEF. Mild LVH. Very mild aortic stenosis.  Assessment/Plan 1. Chest pain concerning for [name] 2. Chronic renal insufficiency. S. Cr stable from 02/09/2012. 3. Hypertension 4. Orthostatic hypotension 5. H/O Carcinoid tumor resection and follows oncology at [facility]. 6. Mild AS by physical exam.  Rec: Schedule for cardiac  catheterization, and possible angioplasty. We discussed regarding risks, benefits, alternatives to this including stress testing, CTA and continued medical therapy. Patient wants to proceed. Understands <1-2% risk of death, stroke, MI, urgent CABG, bleeding, infection, renal failure but not limited to these. Video recording of the procedure shown to the patient. I have met with his wife also and she agrees that he has never had these symptoms and would like to proceed.  Pamella Pert, MD 07/06/2013, 6:26 AM [facility] [place] Pager: [phone] Office: [phone] If no answer: Cell:  [phone]

## 2013-07-06 NOTE — CV Procedure (Signed)
Procedure performed:  Left heart catheterization including hemodynamic monitoring of the left ventricle hemodynamics, . Selective right and left coronary arteriography. PTCA and stenting of the proximal circumflex coronary artery with implantation of a 4.0 x 16 mm Rebel  non-drug-eluting stent.  Indication: Patient is a 79 year-old male with history of hypertension,  hyperlipidemia, who presents with unstable angina pectoris.  Hence is brought to the cardiac catheterization lab to evaluate  coronary anatomy for definitive diagnosis of CAD.  Hemodynamic data: Left ventricular pressure was 120/5 with LVEDP of 9 mm mercury. Aortic pressure was 99/64 with a mean of 70 mm mercury. There was 20 mm Hg pressure gradient across the aortic valve suggestive of mild aortic stenosis.   Left ventricle: Not Performed.  Right coronary artery: The vessel is  large caliber vessel with mild luminal irregularity. It is Dominant.  Left main coronary artery is large and normal.  Circumflex coronary artery: A large vessel giving origin to a large obtuse marginal 1. Proximal segment of the circumflex coronary artery has a high-grade 99% stenosis. Mild calcification is evident.  LAD:  LAD gives origin to a large diagonal-1.  LAD has mild luminal irregularities.   Impression: Single-vessel coronary artery disease, circumflex coronary showing a proximal 99% stenosis.  Interventional data: Successful PTCA and stenting of the proximal circumflex coronary artery with implantation of a 4.0 x 16 mm Rebel  non-drug-eluting stent. Will need Dual antiplatelet therapy with Brilinta and ASA 81 mg for at least 3 months but prefer longer due to [name]. Patient has history of remote GI bleed. A total of 80 cc of contrast was utilized for diagnostic and interventional procedure.  Technique of diagnostic cardiac catheterization:  Under sterile precautions using a 6 French right radial  arterial access, a 6 French sheath was introduced  into the right radial artery. A 5 [place] Tig 4 catheter was advanced into the ascending aorta selective  right coronary artery and left coronary artery was cannulated and angiography was performed in multiple views. The catheter was pulled back Out of the body over exchange length J-wire.  Same Catheter was used to perform LV hemodynamics. Catheter exchanged out of the body over J-Wire. NO immediate complications noted. Hemostasis achieved with TR band. Patient tolerated the procedure well.   Technique of intervention:  Using a 6 [place] XB 3.5 guide catheter the LM  coronary  was selected and cannulated. Using Angiomax for anticoagulation, I utilized a BMW guidewire and across the Circumflex coronary artery without any difficulty. I placed the tip of the wire into the distal  coronary artery. Angiography was performed. Then I utilized a 2.5 x 12 mm emerge balloon , I performed balloon angioplasty at 14 atmospheric pressure x 40  seconds each. I proceeded with implantation of a 4.0 x 16 mm Rebel Non drug-eluting stent into the proximal coronary artery. The stent was deployed at 11 atmospheric pressure for 70  seconds. The stent was then post dilated with a 4.0 x8 mm [place] Apex balloon throughout the stented segment at 14 atmospheric pressure x2 for 30 seconds each. Post-balloon angioplasty results were excellent with reduction of stenosis from 95-99% to 0% residual stenoses and TIMI-3 flow was maintained. There was no evidence of edge dissection. The guidewire was withdrawn out of the body and the guide catheter was engaged and pulled out of the body over the J-wire the was no immediate complication. Patient tolerated the procedure well.  Disposition: Patient will be discharged in morning unless complications with out-patient  follow up.

## 2013-07-06 NOTE — Interval H&P Note (Signed)
History and Physical Interval Note:  07/06/2013 7:47 AM  Matthew Brennan  has presented today for surgery, with the diagnosis of cp  The various methods of treatment have been discussed with the patient and family. After consideration of risks, benefits and other options for treatment, the patient has consented to  Procedure(s): LEFT HEART CATHETERIZATION WITH CORONARY ANGIOGRAM (N/A) and possible PCI as a surgical intervention .  The patient's history has been reviewed, patient examined, no change in status, stable for surgery.  I have reviewed the patient's chart and labs.  Questions were answered to the patient's satisfaction.   Cath Lab Visit (complete for each Cath Lab visit)  Clinical Evaluation Leading to the Procedure:   ACS: yes  Non-ACS:    Anginal Classification: CCS IV  Anti-ischemic medical therapy: Minimal Therapy (1 class of medications)  Non-Invasive Test Results: No non-invasive testing performed  Prior CABG: No previous CABG         [facility]

## 2013-07-06 NOTE — ED Notes (Signed)
Wallet sent home wife.

## 2013-07-06 NOTE — Progress Notes (Signed)
TR BAND REMOVAL  LOCATION:    right radial  DEFLATED PER PROTOCOL:    yes  TIME BAND OFF / DRESSING APPLIED:    1230   SITE UPON ARRIVAL:    Level 0  SITE AFTER BAND REMOVAL:    Level 0  REVERSE ALLEN'S TEST:     positive  CIRCULATION SENSATION AND MOVEMENT:    Within Normal Limits   yes  COMMENTS:   Tolerated procedure well 

## 2013-07-06 NOTE — ED Provider Notes (Signed)
CSN: [id number]     Arrival date & time 07/05/13  2025 History     First MD Initiated Contact with Patient 07/05/13 2336     Chief Complaint  Patient presents with  . Chest Pain   (Consider location/radiation/quality/duration/timing/severity/associated sxs/prior Treatment) HPI 79 yo male presents to the ER from home with complaint of chest pain.  Chest pain started tonight while walking his dog around 730 pm.  Pain started in bilateral axillas and crossed across his chest in a tight band.  He had diaphoresis, sob associated with this pain.  Pt was nauseated.  Sxs lasted about 45 minutes before resolving.  No prior h/o same.  Pt has CAD on pmh, but no prior w/u for same, denies h/o CAD.  Pt has aortic stenosis, was seen by his cardiologist earlier this week for routine f/u.  Pt reports over the last month he has had several episodes where he will get weak and dizzy, but has not had chest pain or syncope.  Past Medical History  Diagnosis Date  . Mitral valve prolapse     last visit w/ cardiologist 1999 in [place]  . Obesity   . Testosterone deficiency   . ED (erectile dysfunction)   . Diverticulitis   . Hx SBO   . CAD (coronary artery disease)   . Abdominal distension   . Abdominal pain   . Diverticulosis of colon without diverticulitis   . Dysrhythmia     palpitations from MVP occ  . Pneumonia     as a child  . Enlarged prostate with lower urinary tract symptoms (LUTS)   . Carcinoid tumor of intestine   . Recurrent upper respiratory infection (URI)   . 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 Blood transfusion 1992    s/p GI bleed  . Heart murmur   . Hypertension   . Thyroid disease     hypothyroidism  . Hypothyroidism    Past Surgical History  Procedure Laterality Date  . No prior surgeries    . Eye surgery  2000,2003    cataract ext/iol bilateral  . No past surgeries    . Mouth surgery  [phone] 2004  . Exploritory laperotmy with small bowel  resection  02/17/2012  . Laparotomy  02/17/2012    Procedure: EXPLORATORY LAPAROTOMY;  Surgeon: Ernestene Mention, MD;  Location: [facility] OR;  Service: General;  Laterality: N/A;  . Bowel resection  02/17/2012    Procedure: SMALL BOWEL RESECTION;  Surgeon: Ernestene Mention, MD;  Location: [facility] OR;  Service: General;;  small bowel resection and wedge resection small  bowel mass.   Family History  Problem Relation Age of Onset  . Kidney disease Father   . Colon cancer Neg Hx   . Stomach cancer Neg Hx   . Anesthesia problems Neg Hx    History  Substance Use Topics  . Smoking status: Former Smoker -- 4.00 packs/day for 5 years    Types: Cigarettes    Quit date: 01/06/1962  . Smokeless tobacco: Never Used  . Alcohol Use: No    Review of Systems  All other systems reviewed and are negative.    Allergies  Aspirin; Ciprofloxacin; and Levothyroxine sodium  Home Medications  No current outpatient prescriptions on file. BP 146/69  Pulse 72  Temp(Src) 97.5 F (36.4 C) (Oral)  Resp 18  Ht 5\' 9"  (1.753 m)  Wt 166 lb 7.2 oz (75.5 kg)  BMI 24.57 kg/m2  SpO2 99%  Physical Exam  Nursing note and vitals reviewed. Constitutional: He is oriented to person, place, and time. He appears well-developed and well-nourished.  HENT:  Head: Normocephalic and atraumatic.  Nose: Nose normal.  Mouth/Throat: Oropharynx is clear and moist.  Eyes: Conjunctivae and EOM are normal. Pupils are equal, round, and reactive to light.  Neck: Normal range of motion. Neck supple. No JVD present. No tracheal deviation present. No thyromegaly present.  Cardiovascular: Normal rate, regular rhythm and intact distal pulses.  Exam reveals no gallop and no friction rub.   Murmur heard. Pulmonary/Chest: Effort normal and breath sounds normal. No stridor. No respiratory distress. He has no wheezes. He has no rales. He exhibits no tenderness.  Abdominal: Soft. Bowel sounds are normal. He exhibits no distension and no mass. There  is no tenderness. There is no rebound and no guarding.  Musculoskeletal: Normal range of motion. He exhibits no edema and no tenderness.  Lymphadenopathy:    He has no cervical adenopathy.  Neurological: He is alert and oriented to person, place, and time. He exhibits normal muscle tone. Coordination normal.  Skin: Skin is warm and dry. No rash noted. No erythema. No pallor.  Psychiatric: He has a normal mood and affect. His behavior is normal. Judgment and thought content normal.    ED Course   Procedures (including critical care time)  Labs Reviewed  CBC - Abnormal; Notable for the following:    HCT 38.7 (*)    MCHC 36.2 (*)    All other components within normal limits  BASIC METABOLIC PANEL - Abnormal; Notable for the following:    Glucose, Bld 101 (*)    BUN 31 (*)    Creatinine, Ser 1.50 (*)    GFR calc non Af Amer 43 (*)    GFR calc Af Amer 50 (*)    All other components within normal limits  TROPONIN I  HEPARIN LEVEL (UNFRACTIONATED)  CBC  POCT I-STAT TROPONIN I   Dg Chest 2 View  07/05/2013   *RADIOLOGY REPORT*  Clinical Data: Chest pain  CHEST - 2 VIEW  Comparison: 02/09/2012  Findings: Cardiomediastinal silhouette is within normal limits. The lungs are clear. No pleural effusion.  No pneumothorax.  No acute osseous abnormality.  IMPRESSION: Normal chest.   Original Report Authenticated By: Christiana Pellant, M.D.    Date: 07/05/2013  Rate: 76  Rhythm: normal sinus rhythm, premature atrial contractions (PAC) and premature ventricular contractions (PVC)  QRS Axis: normal  Intervals: normal  ST/T Wave abnormalities: normal  Conduction Disutrbances:none  Narrative Interpretation:   Old EKG Reviewed: changes noted   Date: 07/06/2013  Rate: 62  Rhythm: sinus arrhythmia  QRS Axis: normal  Intervals: normal  ST/T Wave abnormalities: normal  Conduction Disutrbances:none  Narrative Interpretation:   Old EKG Reviewed: changes noted     1. Chest pain     MDM  79  yo male with concerning story for ACS.  EKG and initial troponin without ST elevation.  Pt in intermittent afib during evaluation in ED tonight, reports h/o same in the past.  No pain at present.  D/w Dr Jacinto Halim, pt's cardiologist who requests admission to obs, heparin for unstable angina, and serial markers.  He will see the patient in the am.  Pt and wife updated on plan.   Olivia Mackie, MD 07/06/13 [phone]

## 2013-07-06 NOTE — Progress Notes (Signed)

## 2013-07-07 ENCOUNTER — Encounter (HOSPITAL_COMMUNITY): Payer: Self-pay | Admitting: *Deleted

## 2013-07-07 LAB — BASIC METABOLIC PANEL
BUN: 22 mg/dL (ref 6–23)
CO2: 22 mEq/L (ref 19–32)
Calcium: 8.8 mg/dL (ref 8.4–10.5)
Chloride: 108 mEq/L (ref 96–112)
Creatinine, Ser: 1.09 mg/dL (ref 0.50–1.35)
GFR calc Af Amer: 73 mL/min — ABNORMAL LOW (ref >90)
GFR calc non Af Amer: 63 mL/min — ABNORMAL LOW (ref >90)
Glucose, Bld: 99 mg/dL (ref 70–99)
Potassium: 3.8 mEq/L (ref 3.5–5.1)
Sodium: 141 mEq/L (ref 135–145)

## 2013-07-07 LAB — CBC
HCT: 35.4 % — ABNORMAL LOW (ref 39.0–52.0)
Hemoglobin: 12.2 g/dL — ABNORMAL LOW (ref 13.0–17.0)
MCH: 31 pg (ref 26.0–34.0)
MCHC: 34.5 g/dL (ref 30.0–36.0)
MCV: 90.1 fL (ref 78.0–100.0)
Platelets: 153 10*3/uL (ref 150–400)
RBC: 3.93 MIL/uL — ABNORMAL LOW (ref 4.22–5.81)
RDW: 13.2 % (ref 11.5–15.5)
WBC: 6.3 10*3/uL (ref 4.0–10.5)

## 2013-07-07 MED ORDER — ATORVASTATIN CALCIUM 10 MG PO TABS: 10.0000 mg | ORAL_TABLET | Freq: Every day | ORAL | Status: DC

## 2013-07-07 MED ORDER — TESTOSTERONE 50 MG/5GM (1%) TD GEL
TRANSDERMAL | Status: AC
  Administered 2013-07-07: 09:00:00 5 g via TRANSDERMAL
  Filled 2013-07-07: qty 5

## 2013-07-07 MED ORDER — TICAGRELOR 90 MG PO TABS: 90.0000 mg | ORAL_TABLET | Freq: Two times a day (BID) | ORAL | Status: DC

## 2013-07-07 MED ORDER — METOPROLOL TARTRATE 25 MG PO TABS: 25.0000 mg | ORAL_TABLET | Freq: Two times a day (BID) | ORAL | Status: DC

## 2013-07-07 MED ORDER — ASPIRIN 81 MG PO TABS: 81.0000 mg | ORAL_TABLET | Freq: Every day | ORAL | Status: DC

## 2013-07-07 MED FILL — Sodium Chloride IV Soln 0.9%: INTRAVENOUS | Qty: 50 | Status: AC

## 2013-07-07 NOTE — Progress Notes (Signed)
Utilization Review Completed Landers Prajapati J. Burnis Kaser, RN, BSN, NCM [phone]  

## 2013-07-07 NOTE — Progress Notes (Signed)
CARDIAC REHAB PHASE I   PRE:  Rate/Rhythm: 73SR PACs  BP:  Supine: 110/52  Sitting:  Standing:    SaO2:   MODE:  Ambulation: 1000 ft   POST:  Rate/Rhythm: 77 SR PACs  BP:  Supine:   Sitting: 139/61  Standing:    SaO2:  0755-0850 Pt walked 1000 ft on RA with steady gait. Tolerated well. Denied CP. Education completed. Understanding voiced. Discussed CRP 2 but pt declined. Getting elliptical and is very active walking. Wants to exercise on his own. Walks 2 1/2 miles every day. Gave heart healthy diet and reviewed. Gave brillinta packet.   Luetta Nutting, RN BSN  07/07/2013 8:44 AM

## 2013-07-07 NOTE — Discharge Summary (Signed)
Physician Discharge Summary  Patient ID: Matthew Brennan MRN: [id number] DOB/AGE: 1932-06-22 79 y.o.  Admit date: 07/05/2013 Discharge date: 07/07/2013  Primary Discharge Diagnosis Matthew Brennan CAD Secondary Discharge Diagnosis Hypertension Orthostatic hypotension Chronic renal insufficiency  Significant Diagnostic Studies: 07/06/2013: Procedure performed:  Left heart catheterization including hemodynamic monitoring of the left ventricle hemodynamics, . Selective right and left coronary arteriography. PTCA and stenting of the proximal circumflex coronary artery with implantation of a 4.0 x 16 mm Rebel non-drug-eluting stent.  Indication: Patient is a 79 year-old male with history of hypertension, hyperlipidemia, who presents with unstable angina pectoris. Hence is brought to the cardiac catheterization lab to evaluate coronary anatomy for definitive diagnosis of CAD.  Hemodynamic data:  Left ventricular pressure was 120/5 with LVEDP of 9 mm mercury. Aortic pressure was 99/64 with a mean of 70 mm mercury. There was 20 mm Hg pressure gradient across the aortic valve suggestive of mild aortic stenosis.  Left ventricle: Not Performed.  Right coronary artery: The vessel is large caliber vessel with mild luminal irregularity. It is Dominant.  Left main coronary artery is large and normal.  Circumflex coronary artery: A large vessel giving origin to a large obtuse marginal 1. Proximal segment of the circumflex coronary artery has a high-grade 99% stenosis. Mild calcification is evident.  LAD: LAD gives origin to a large diagonal-1. LAD has mild luminal irregularities.  Impression: Single-vessel coronary artery disease, circumflex coronary showing a proximal 99% stenosis.  Interventional data: Successful PTCA and stenting of the proximal circumflex coronary artery with implantation of a 4.0 x 16 mm Rebel non-drug-eluting stent. Will need Dual antiplatelet therapy with Brilinta and ASA 81 mg for at least 3  months but prefer longer due to Matthew Brennan. Patient has history of remote GI bleed. A total of 80 cc of contrast was utilized for diagnostic and interventional procedure.   Hospital Course:  Patient come in to hospital as Matthew Brennan. Chest pain with exertion and dyspnea associated with diaphoresis and nausea. No EKG changes. However symptoms classic. Underwent coronary angiogram and PTCA the following morning. Post PTCA no symptoms and stable for discharge. Discharge Exam: Blood pressure 110/56, pulse 66, temperature 97.6 F (36.4 C), temperature source Oral, resp. rate 18, height 5\' 9"  (1.753 m), weight 80.6 kg (177 lb 11.1 oz), SpO2 98.00%.  There is no cyanosis. HEENT: normal limits. PERRLA, No JVD.  CARDIAC EXAM: S1, S2 normal, no gallop present. II-III/VI SEM in the RSB.  CHEST EXAM: No tenderness of chest wall. LUNGS: Clear to percuss and auscultate.  ABDOMEN: No hepatosplenomegaly. BS normal in all 4 quadrants. Abdomen is non-tender.  EXTREMITY: Full range of movementes, No edema. MUSCULOSKELETAL EXAM: Intact with full range of motion in all 4 extremities.  NEUROLOGIC EXAM: Grossly intact without any focal deficits. Alert O x 3.  VASCULAR EXAM: No skin breakdown. Carotids normal. Extremities: Femoral pulse normal   Labs:   Lab Results  Component Value Date   WBC 6.3 07/07/2013   HGB 12.2* 07/07/2013   HCT 35.4* 07/07/2013   MCV 90.1 07/07/2013   PLT 153 07/07/2013    Recent Labs Lab 07/07/13 0556  NA 141  K 3.8  CL 108  CO2 22  BUN 22  CREATININE 1.09  CALCIUM 8.8  GLUCOSE 99   Lab Results  Component Value Date   TROPONINI <0.30 07/06/2013    EKG: 07/07/13: NSR, LAD, LAFB, LVH, PAC. No ischemia. No change from EKG yesterday.    Radiology: Dg Chest 2 View  07/05/2013   *  RADIOLOGY REPORT*  Clinical Data: Chest pain  CHEST - 2 VIEW  Comparison: 02/09/2012  Findings: Cardiomediastinal silhouette is within normal limits. The lungs are clear. No pleural effusion.  No pneumothorax.  No acute  osseous abnormality.  IMPRESSION: Normal chest.   Original Report Authenticated By: Christiana Pellant, M.D.      FOLLOW UP PLANS AND APPOINTMENTS  Future Appointments Provider Department Dept Phone   09/04/2013 10:00 AM Windell Hummingbird [facility] MEDICAL ONCOLOGY [phone]   09/18/2013 10:30 AM Ladene Artist, MD [facility] MEDICAL ONCOLOGY [phone]       Medication List         aspirin 81 MG tablet  Take 1 tablet (81 mg total) by mouth daily.     atorvastatin 10 MG tablet  Commonly known as:  LIPITOR  Take 1 tablet (10 mg total) by mouth daily at 6 PM.     diphenhydramine-acetaminophen 25-500 MG Tabs  Commonly known as:  TYLENOL PM  Take 1 tablet by mouth at bedtime.     Melatonin 10 MG Tabs  Take 10 mg by mouth daily.     metoprolol tartrate 25 MG tablet  Commonly known as:  LOPRESSOR  Take 1 tablet (25 mg total) by mouth 2 (two) times daily.     MULTIVITAL PO  Take 1 capsule by mouth daily. One-A-Day     testosterone 50 MG/5GM Gel  Commonly known as:  ANDROGEL  Place 5 g onto the skin daily.     Ticagrelor 90 MG Tabs tablet  Commonly known as:  BRILINTA  Take 1 tablet (90 mg total) by mouth 2 (two) times daily.     valsartan-hydrochlorothiazide 160-12.5 MG per tablet  Commonly known as:  DIOVAN-HCT  Take 1 tablet by mouth daily.     vitamin C 1000 MG tablet  Take 3,000 mg by mouth daily.     vitamin E 1000 UNIT capsule  Take 1,000 Units by mouth daily.           Follow-up Information   Follow up with Pamella Pert, MD. (Please call for appointment to be seen in 2 weeks)    Specialty:  Cardiology   Contact information:   [address] [place] [address] [phone]        Pamella Pert, MD 07/07/2013, 8:49 AM  Pager: [phone] Office: [phone] If no answer: [phone]

## 2013-09-04 ENCOUNTER — Other Ambulatory Visit: Payer: Medicare HMO | Admitting: Lab

## 2013-09-04 ENCOUNTER — Other Ambulatory Visit: Payer: Self-pay | Admitting: Oncology

## 2013-09-04 ENCOUNTER — Encounter (INDEPENDENT_AMBULATORY_CARE_PROVIDER_SITE_OTHER): Payer: Self-pay

## 2013-09-04 DIAGNOSIS — D3A00 Benign carcinoid tumor of unspecified site: Secondary | ICD-10-CM

## 2013-09-08 LAB — CHROMOGRANIN A: Chromogranin A: 25 ng/mL — ABNORMAL HIGH (ref 1.9–15.0)

## 2013-09-10 LAB — 5 HIAA, QUANTITATIVE, URINE, 24 HOUR
5-HIAA, 24 Hr Urine: 4.7 mg/24 h (ref <=6.0)
Volume, Urine-5HIAA: 2250 mL/24 h

## 2013-09-18 ENCOUNTER — Telehealth: Payer: Self-pay | Admitting: Oncology

## 2013-09-18 ENCOUNTER — Encounter (INDEPENDENT_AMBULATORY_CARE_PROVIDER_SITE_OTHER): Payer: Self-pay

## 2013-09-18 ENCOUNTER — Ambulatory Visit (HOSPITAL_BASED_OUTPATIENT_CLINIC_OR_DEPARTMENT_OTHER): Payer: Medicare HMO | Admitting: Oncology

## 2013-09-18 VITALS — BP 132/75 | HR 55 | Temp 98.0°F | Resp 17 | Ht 69.0 in | Wt 175.3 lb

## 2013-09-18 DIAGNOSIS — C7A011 Malignant carcinoid tumor of the jejunum: Secondary | ICD-10-CM

## 2013-09-18 NOTE — Telephone Encounter (Signed)
Per 10/27 Dr Truett Perna POF appts made lvmm for pt and mailed calendar [facility]

## 2013-09-18 NOTE — Progress Notes (Signed)
   [facility]    OFFICE PROGRESS NOTE   INTERVAL HISTORY:   He returns for scheduled followup of a carcinoid tumor. He feels well. No diarrhea, flushing, or anorexia.  He was admitted with unstable angina in August and underwent a percutaneous intervention with placement of a stent.  Objective:  Vital signs in last 24 hours:  Blood pressure 132/75, pulse 55, temperature 98 F (36.7 C), temperature source Oral, resp. rate 17, height 5\' 9"  (1.753 m), weight 175 lb 4.8 oz (79.516 kg), SpO2 100.00%.    HEENT: Neck without mass Lymphatics: No cervical, supraclavicular, axillary, or inguinal nodes Resp: Lungs clear bilaterally Cardio: Regular rate and rhythm GI: No hepatosplenomegaly, nontender, no mass Vascular: No leg edema   Lab Results:  Chromogranin A on 09/04/2013-25 (24 on 11/29/2012)  24-hour urine 5 HIAA on 09/07/2013-4.7  X-rays: Chest x-ray 07/05/2013-normal  Medications: I have reviewed the patient's current medications.  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 History of a partial small bowel obstruction secondary to the small bowel carcinoid tumor  3. Elevated preoperative chromogranin A and 24-hour urine 5 HIAA levels  4. Small lung nodules seen on the CT of the abdomen 12/23/2011-likely benign  5. Coronary artery disease, status post placement of a coronary stent August 2014   Disposition:  There is no clinical evidence for progression of the carcinoid tumor. The 24-hour urine 5 HIAA is normal and the chromogranin A level is stable. He will return for an office and lab visit in 9 months. We discussed the indication for surveillance CT scans. We decided against CT scans given the  lack of a clear benefit for surveillance scans in this setting.   Thornton Papas, MD  09/18/2013  6:38 PM

## 2014-06-11 ENCOUNTER — Other Ambulatory Visit (HOSPITAL_BASED_OUTPATIENT_CLINIC_OR_DEPARTMENT_OTHER): Payer: Medicare HMO

## 2014-06-11 DIAGNOSIS — C7A011 Malignant carcinoid tumor of the jejunum: Secondary | ICD-10-CM

## 2014-06-15 LAB — CHROMOGRANIN A: Chromogranin A: 13 ng/mL (ref <=15)

## 2014-06-17 LAB — 5 HIAA, QUANTITATIVE, URINE, 24 HOUR
5-HIAA, 24 Hr Urine: 2.9 mg/24 h (ref <=6.0)
Volume, Urine-5HIAA: 2600 mL/24 h

## 2014-06-18 ENCOUNTER — Ambulatory Visit (HOSPITAL_BASED_OUTPATIENT_CLINIC_OR_DEPARTMENT_OTHER): Payer: Medicare HMO | Admitting: Oncology

## 2014-06-18 VITALS — BP 144/62 | HR 54 | Temp 97.4°F | Resp 18 | Ht 69.0 in | Wt 177.5 lb

## 2014-06-18 DIAGNOSIS — C7A011 Malignant carcinoid tumor of the jejunum: Secondary | ICD-10-CM

## 2014-06-18 NOTE — Progress Notes (Signed)
  Matthew Brennan OFFICE PROGRESS NOTE   Diagnosis: Carcinoid tumor  INTERVAL HISTORY:   He returns as scheduled. No diarrhea, flushing, or anorexia. No specific complaint.  Objective:  Vital signs in last 24 hours:  Blood pressure 144/62, pulse 54, temperature 97.4 F (36.3 C), temperature source Oral, resp. rate 18, height 5\' 9"  (1.753 m), weight 177 lb 8 oz (80.513 kg), SpO2 100.00%.    HEENT: Neck without mass Lymphatics: No cervical, supraclavicular, axillary, or inguinal nodes Resp: Lungs clear bilaterally Cardio: Regular rate and rhythm, 2/6 systolic murmur GI: No hepatomegaly, nontender, no mass Vascular: No leg edema  Laboratory: Chromogranin A on  06/11/2014-13 A 24 urine 5 HIAA on 06/15/2014-2.9   Medications: I have reviewed the patient's current medications.  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 History of a partial small bowel obstruction secondary to the small bowel carcinoid tumor  3. Elevated preoperative chromogranin A and 24-hour urine 5 HIAA levels  4. Small lung nodules seen on the CT of the abdomen 12/23/2011-likely benign  5. Coronary artery disease, status post placement of a coronary stent August 2014      Disposition:  Matthew Brennan remains in clinical remission from the carcinoid tumor. He will return for an office and lab visit in one year.  Betsy Coder, MD  06/18/2014  12:06 PM

## 2014-06-21 ENCOUNTER — Telehealth: Payer: Self-pay | Admitting: Oncology

## 2014-06-21 NOTE — Telephone Encounter (Signed)
Per 07/27 POF, labs/ov, mailed AVS to pt.Marland KitchenKJ

## 2014-11-01 ENCOUNTER — Encounter (HOSPITAL_COMMUNITY): Payer: Self-pay | Admitting: Cardiology

## 2014-12-19 DIAGNOSIS — Z08 Encounter for follow-up examination after completed treatment for malignant neoplasm: Secondary | ICD-10-CM | POA: Diagnosis not present

## 2014-12-19 DIAGNOSIS — D225 Melanocytic nevi of trunk: Secondary | ICD-10-CM | POA: Diagnosis not present

## 2014-12-19 DIAGNOSIS — L570 Actinic keratosis: Secondary | ICD-10-CM | POA: Diagnosis not present

## 2014-12-19 DIAGNOSIS — X32XXXD Exposure to sunlight, subsequent encounter: Secondary | ICD-10-CM | POA: Diagnosis not present

## 2014-12-19 DIAGNOSIS — Z85828 Personal history of other malignant neoplasm of skin: Secondary | ICD-10-CM | POA: Diagnosis not present

## 2015-01-31 DIAGNOSIS — I6521 Occlusion and stenosis of right carotid artery: Secondary | ICD-10-CM | POA: Diagnosis not present

## 2015-02-13 DIAGNOSIS — R0789 Other chest pain: Secondary | ICD-10-CM | POA: Diagnosis not present

## 2015-02-13 DIAGNOSIS — R0989 Other specified symptoms and signs involving the circulatory and respiratory systems: Secondary | ICD-10-CM | POA: Diagnosis not present

## 2015-02-13 DIAGNOSIS — E340 Carcinoid syndrome: Secondary | ICD-10-CM | POA: Diagnosis not present

## 2015-02-13 DIAGNOSIS — I25119 Atherosclerotic heart disease of native coronary artery with unspecified angina pectoris: Secondary | ICD-10-CM | POA: Diagnosis not present

## 2015-04-10 DIAGNOSIS — I2510 Atherosclerotic heart disease of native coronary artery without angina pectoris: Secondary | ICD-10-CM | POA: Diagnosis not present

## 2015-04-10 DIAGNOSIS — Z79899 Other long term (current) drug therapy: Secondary | ICD-10-CM | POA: Diagnosis not present

## 2015-04-10 DIAGNOSIS — I10 Essential (primary) hypertension: Secondary | ICD-10-CM | POA: Diagnosis not present

## 2015-04-10 DIAGNOSIS — E039 Hypothyroidism, unspecified: Secondary | ICD-10-CM | POA: Diagnosis not present

## 2015-04-10 DIAGNOSIS — R221 Localized swelling, mass and lump, neck: Secondary | ICD-10-CM | POA: Diagnosis not present

## 2015-04-16 ENCOUNTER — Other Ambulatory Visit: Payer: Self-pay | Admitting: Family Medicine

## 2015-04-16 DIAGNOSIS — R221 Localized swelling, mass and lump, neck: Secondary | ICD-10-CM

## 2015-05-24 ENCOUNTER — Telehealth: Payer: Self-pay | Admitting: Oncology

## 2015-05-24 NOTE — Telephone Encounter (Signed)
S/w pt confirming labs/ov moved to am per provider schedule, mailed out schedule to pt 06/10... KJ

## 2015-06-07 DIAGNOSIS — R221 Localized swelling, mass and lump, neck: Secondary | ICD-10-CM | POA: Diagnosis not present

## 2015-06-10 ENCOUNTER — Other Ambulatory Visit: Payer: Self-pay | Admitting: Family Medicine

## 2015-06-10 DIAGNOSIS — R221 Localized swelling, mass and lump, neck: Secondary | ICD-10-CM

## 2015-06-11 ENCOUNTER — Other Ambulatory Visit: Payer: Self-pay | Admitting: Family Medicine

## 2015-06-11 DIAGNOSIS — R221 Localized swelling, mass and lump, neck: Secondary | ICD-10-CM

## 2015-06-13 ENCOUNTER — Ambulatory Visit
Admission: RE | Admit: 2015-06-13 | Discharge: 2015-06-13 | Disposition: A | Discharge status: Home / Self Care | Payer: Medicare HMO | Source: Ambulatory Visit | Attending: Family Medicine | Admitting: Family Medicine

## 2015-06-13 DIAGNOSIS — R221 Localized swelling, mass and lump, neck: Secondary | ICD-10-CM | POA: Diagnosis not present

## 2015-06-17 ENCOUNTER — Other Ambulatory Visit: Payer: Medicare HMO

## 2015-06-20 ENCOUNTER — Ambulatory Visit (HOSPITAL_BASED_OUTPATIENT_CLINIC_OR_DEPARTMENT_OTHER): Payer: Commercial Managed Care - HMO | Admitting: Oncology

## 2015-06-20 VITALS — BP 136/62 | HR 52 | Temp 97.6°F | Resp 17 | Ht 69.0 in | Wt 176.6 lb

## 2015-06-20 DIAGNOSIS — C7A011 Malignant carcinoid tumor of the jejunum: Secondary | ICD-10-CM | POA: Diagnosis not present

## 2015-06-20 DIAGNOSIS — R911 Solitary pulmonary nodule: Secondary | ICD-10-CM

## 2015-06-20 NOTE — Progress Notes (Signed)
  Matthew Brennan OFFICE PROGRESS NOTE   Diagnosis: Carcinoid tumor  INTERVAL HISTORY:   He returns as scheduled. He feels well. No diarrhea or flushing. Good appetite. He has noted a fullness in the right lower neck for the past few months. An ultrasound on 06/13/2015 revealed a 3.7 x 1.4 x 2.1 cm lesion in the superficial subcutaneous fat consistent with a lipoma. He has been scheduled for a neck CT.  Objective:  Vital signs in last 24 hours:  Blood pressure 136/62, pulse 52, temperature 97.6 F (36.4 C), temperature source Oral, resp. rate 17, height 5\' 9"  (1.753 m), weight 176 lb 9.6 oz (80.105 kg), SpO2 100 %.    HEENT: Neck without mass Lymphatics: No cervical, supraclavicular, axillary, or inguinal nodes Resp: Lungs clear bilaterally Cardio: Regular rate and rhythm, 2/6 systolic murmur GI: No hepatosplenomegaly, nontender, no mass Vascular: No leg edema  Skin: Soft fullness appears to be in the cutaneous tissue at the right supraclavicular region. No discrete mass.     Lab Results: 24 urine 5 HIAA on 06/15/2014-2.9 Chromogranin A on 7 22015-13  Medications: I have reviewed the patient's current medications.  Assessment/Plan: 1. 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 History of a partial small bowel obstruction secondary to the small bowel carcinoid tumor  3. Elevated preoperative chromogranin A and 24-hour urine 5 HIAA levels  4. Small lung nodules seen on the CT of the abdomen 12/23/2011-likely benign  5. Coronary artery disease, status post placement of a coronary stent August 2014   Disposition:  Matthew Brennan remains in clinical remission from the carcinoid tumor. He will return for an office and lab visit  in one year.  The right low neck fullness is most likely secondary to a lipoma. He has been scheduled for a CT by Dr. Dorthy Cooler. I am available to see him if a significant abnormalitie is found.  Betsy Coder, MD  06/20/2015  9:31 AM

## 2015-06-27 ENCOUNTER — Other Ambulatory Visit: Payer: Medicare HMO

## 2015-08-08 DIAGNOSIS — I6529 Occlusion and stenosis of unspecified carotid artery: Secondary | ICD-10-CM | POA: Diagnosis not present

## 2015-08-22 DIAGNOSIS — E340 Carcinoid syndrome: Secondary | ICD-10-CM | POA: Diagnosis not present

## 2015-08-22 DIAGNOSIS — R0989 Other specified symptoms and signs involving the circulatory and respiratory systems: Secondary | ICD-10-CM | POA: Diagnosis not present

## 2015-08-22 DIAGNOSIS — E785 Hyperlipidemia, unspecified: Secondary | ICD-10-CM | POA: Diagnosis not present

## 2015-08-22 DIAGNOSIS — I25119 Atherosclerotic heart disease of native coronary artery with unspecified angina pectoris: Secondary | ICD-10-CM | POA: Diagnosis not present

## 2015-10-14 DIAGNOSIS — E785 Hyperlipidemia, unspecified: Secondary | ICD-10-CM | POA: Diagnosis not present

## 2015-10-14 DIAGNOSIS — E039 Hypothyroidism, unspecified: Secondary | ICD-10-CM | POA: Diagnosis not present

## 2015-10-14 DIAGNOSIS — I2510 Atherosclerotic heart disease of native coronary artery without angina pectoris: Secondary | ICD-10-CM | POA: Diagnosis not present

## 2015-10-14 DIAGNOSIS — I10 Essential (primary) hypertension: Secondary | ICD-10-CM | POA: Diagnosis not present

## 2015-10-14 DIAGNOSIS — Z79899 Other long term (current) drug therapy: Secondary | ICD-10-CM | POA: Diagnosis not present

## 2015-10-14 DIAGNOSIS — R7301 Impaired fasting glucose: Secondary | ICD-10-CM | POA: Diagnosis not present

## 2015-10-14 DIAGNOSIS — L570 Actinic keratosis: Secondary | ICD-10-CM | POA: Diagnosis not present

## 2015-10-14 DIAGNOSIS — Z0001 Encounter for general adult medical examination with abnormal findings: Secondary | ICD-10-CM | POA: Diagnosis not present

## 2015-10-30 DIAGNOSIS — L570 Actinic keratosis: Secondary | ICD-10-CM | POA: Diagnosis not present

## 2015-10-30 DIAGNOSIS — X32XXXD Exposure to sunlight, subsequent encounter: Secondary | ICD-10-CM | POA: Diagnosis not present

## 2015-11-28 DIAGNOSIS — I6523 Occlusion and stenosis of bilateral carotid arteries: Secondary | ICD-10-CM | POA: Diagnosis not present

## 2016-04-15 DIAGNOSIS — Z79899 Other long term (current) drug therapy: Secondary | ICD-10-CM | POA: Diagnosis not present

## 2016-04-15 DIAGNOSIS — E039 Hypothyroidism, unspecified: Secondary | ICD-10-CM | POA: Diagnosis not present

## 2016-04-15 DIAGNOSIS — I10 Essential (primary) hypertension: Secondary | ICD-10-CM | POA: Diagnosis not present

## 2016-04-15 DIAGNOSIS — I2510 Atherosclerotic heart disease of native coronary artery without angina pectoris: Secondary | ICD-10-CM | POA: Diagnosis not present

## 2016-04-15 DIAGNOSIS — H6122 Impacted cerumen, left ear: Secondary | ICD-10-CM | POA: Diagnosis not present

## 2016-05-27 DIAGNOSIS — I6529 Occlusion and stenosis of unspecified carotid artery: Secondary | ICD-10-CM | POA: Diagnosis not present

## 2016-06-08 DIAGNOSIS — I6523 Occlusion and stenosis of bilateral carotid arteries: Secondary | ICD-10-CM | POA: Diagnosis not present

## 2016-06-08 DIAGNOSIS — I25119 Atherosclerotic heart disease of native coronary artery with unspecified angina pectoris: Secondary | ICD-10-CM | POA: Diagnosis not present

## 2016-06-08 DIAGNOSIS — E785 Hyperlipidemia, unspecified: Secondary | ICD-10-CM | POA: Diagnosis not present

## 2016-06-08 DIAGNOSIS — I350 Nonrheumatic aortic (valve) stenosis: Secondary | ICD-10-CM | POA: Diagnosis not present

## 2016-06-18 ENCOUNTER — Other Ambulatory Visit: Payer: Commercial Managed Care - HMO

## 2016-06-18 DIAGNOSIS — C7A011 Malignant carcinoid tumor of the jejunum: Secondary | ICD-10-CM | POA: Diagnosis not present

## 2016-06-22 LAB — CHROMOGRANIN A: Chromogranin A: 2 nmol/L (ref 0–5)

## 2016-06-23 DIAGNOSIS — C7A011 Malignant carcinoid tumor of the jejunum: Secondary | ICD-10-CM | POA: Diagnosis not present

## 2016-06-24 ENCOUNTER — Telehealth: Payer: Self-pay | Admitting: Oncology

## 2016-06-24 NOTE — Telephone Encounter (Signed)
pt cld wanting time of appt-adv 8/3@10 

## 2016-06-25 ENCOUNTER — Telehealth: Payer: Self-pay | Admitting: Oncology

## 2016-06-25 ENCOUNTER — Ambulatory Visit (HOSPITAL_BASED_OUTPATIENT_CLINIC_OR_DEPARTMENT_OTHER): Payer: Commercial Managed Care - HMO | Admitting: Oncology

## 2016-06-25 VITALS — BP 135/55 | HR 52 | Temp 97.8°F | Resp 18 | Ht 69.0 in | Wt 178.8 lb

## 2016-06-25 DIAGNOSIS — C7A011 Malignant carcinoid tumor of the jejunum: Secondary | ICD-10-CM | POA: Diagnosis not present

## 2016-06-25 DIAGNOSIS — R911 Solitary pulmonary nodule: Secondary | ICD-10-CM | POA: Diagnosis not present

## 2016-06-25 NOTE — Progress Notes (Signed)
  [place] OFFICE PROGRESS NOTE   Diagnosis: Carcinoid tumor  INTERVAL HISTORY:   Matthew Brennan returns as scheduled. He feels well. No flushing or diarrhea. Good appetite. No complaint.  Objective:  Vital signs in last 24 hours:  Blood pressure (!) 135/55, pulse (!) 52, temperature 97.8 F (36.6 C), temperature source Oral, resp. rate 18, height 5\' 9"  (1.753 m), weight 178 lb 12.8 oz (81.1 kg), SpO2 100 %.    HEENT: Soft mass in the right lateral supraclavicular fossa Lymphatics: No cervical, supra-clavicular, axillary, or inguinal nodes Resp: Lungs clear bilaterally Cardio: Regular rate and rhythm, 2/6 systolic murmur GI: No hepatosplenomegaly, no mass Vascular: No leg edema   Lab Results: Chromogranin A on 06/18/2016-to  Medications: I have reviewed the patient's current medications.  Assessment/Plan:  1. 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 History of a partial small bowel obstruction secondary to the small bowel carcinoid tumor  3. Elevated preoperative chromogranin A and 24-hour urine 5 HIAA levels  4. Small lung nodules seen on the CT of the abdomen 12/23/2011-likely benign  5. Coronary artery disease, status post placement of a coronary stent August 2014     Disposition:  He remains in clinical remission from the carcinoid tumor. We will follow-up on the 24-hour urine 5 HIAA submitted last week.  Matthew Brennan will return for an office and lab visit in one year.  Betsy Coder, MD  06/25/2016  10:11 AM

## 2016-06-25 NOTE — Telephone Encounter (Signed)
per pof to sch pt appt-gave pt copy of avs/cal °

## 2016-06-28 LAB — 5 HIAA, QUANTITATIVE, URINE, 24 HOUR
5-HIAA, Urine: 1.4 mg/L
5-HIAA,Quant.,24 Hr Urine: 4.1 mg/24 hr (ref 0.0–14.9)

## 2016-06-30 ENCOUNTER — Telehealth: Payer: Self-pay | Admitting: *Deleted

## 2016-06-30 NOTE — Telephone Encounter (Signed)
Message left on patients private phone to inform him per Dr. Benay Spice that his urine 5-HIAA is normal.  Pt instructed to call [place] back with any questions or concerns.

## 2016-06-30 NOTE — Telephone Encounter (Signed)
-----   Message from Ladell Pier, MD sent at 06/28/2016 10:18 AM EDT ----- Please call patient Matthew Brennan is normal

## 2016-08-12 DIAGNOSIS — Z23 Encounter for immunization: Secondary | ICD-10-CM | POA: Diagnosis not present

## 2016-08-12 DIAGNOSIS — M5431 Sciatica, right side: Secondary | ICD-10-CM | POA: Diagnosis not present

## 2016-08-15 ENCOUNTER — Other Ambulatory Visit: Payer: Self-pay | Admitting: Family Medicine

## 2016-08-15 DIAGNOSIS — M5431 Sciatica, right side: Secondary | ICD-10-CM

## 2016-08-27 ENCOUNTER — Ambulatory Visit
Admission: RE | Admit: 2016-08-27 | Discharge: 2016-08-27 | Disposition: A | Discharge status: Home / Self Care | Payer: Commercial Managed Care - HMO | Source: Ambulatory Visit | Attending: Family Medicine | Admitting: Family Medicine

## 2016-08-27 DIAGNOSIS — M5431 Sciatica, right side: Secondary | ICD-10-CM

## 2016-08-27 DIAGNOSIS — M5126 Other intervertebral disc displacement, lumbar region: Secondary | ICD-10-CM | POA: Diagnosis not present

## 2016-09-25 DIAGNOSIS — M419 Scoliosis, unspecified: Secondary | ICD-10-CM | POA: Diagnosis not present

## 2016-09-25 DIAGNOSIS — M4726 Other spondylosis with radiculopathy, lumbar region: Secondary | ICD-10-CM | POA: Diagnosis not present

## 2016-09-25 DIAGNOSIS — M549 Dorsalgia, unspecified: Secondary | ICD-10-CM | POA: Diagnosis not present

## 2016-09-25 DIAGNOSIS — M4316 Spondylolisthesis, lumbar region: Secondary | ICD-10-CM | POA: Diagnosis not present

## 2016-09-25 DIAGNOSIS — M5431 Sciatica, right side: Secondary | ICD-10-CM | POA: Diagnosis not present

## 2016-10-07 DIAGNOSIS — M6281 Muscle weakness (generalized): Secondary | ICD-10-CM | POA: Diagnosis not present

## 2016-10-07 DIAGNOSIS — M545 Low back pain: Secondary | ICD-10-CM | POA: Diagnosis not present

## 2016-10-09 DIAGNOSIS — M545 Low back pain: Secondary | ICD-10-CM | POA: Diagnosis not present

## 2016-10-09 DIAGNOSIS — M6281 Muscle weakness (generalized): Secondary | ICD-10-CM | POA: Diagnosis not present

## 2016-10-12 DIAGNOSIS — M6281 Muscle weakness (generalized): Secondary | ICD-10-CM | POA: Diagnosis not present

## 2016-10-12 DIAGNOSIS — M545 Low back pain: Secondary | ICD-10-CM | POA: Diagnosis not present

## 2016-10-19 DIAGNOSIS — M545 Low back pain: Secondary | ICD-10-CM | POA: Diagnosis not present

## 2016-10-19 DIAGNOSIS — M6281 Muscle weakness (generalized): Secondary | ICD-10-CM | POA: Diagnosis not present

## 2016-10-21 DIAGNOSIS — M6281 Muscle weakness (generalized): Secondary | ICD-10-CM | POA: Diagnosis not present

## 2016-10-21 DIAGNOSIS — M545 Low back pain: Secondary | ICD-10-CM | POA: Diagnosis not present

## 2016-10-23 DIAGNOSIS — M6281 Muscle weakness (generalized): Secondary | ICD-10-CM | POA: Diagnosis not present

## 2016-10-23 DIAGNOSIS — M545 Low back pain: Secondary | ICD-10-CM | POA: Diagnosis not present

## 2016-10-26 DIAGNOSIS — M6281 Muscle weakness (generalized): Secondary | ICD-10-CM | POA: Diagnosis not present

## 2016-10-26 DIAGNOSIS — M545 Low back pain: Secondary | ICD-10-CM | POA: Diagnosis not present

## 2016-10-28 DIAGNOSIS — M6281 Muscle weakness (generalized): Secondary | ICD-10-CM | POA: Diagnosis not present

## 2016-10-28 DIAGNOSIS — M545 Low back pain: Secondary | ICD-10-CM | POA: Diagnosis not present

## 2016-10-29 DIAGNOSIS — M4316 Spondylolisthesis, lumbar region: Secondary | ICD-10-CM | POA: Diagnosis not present

## 2016-10-29 DIAGNOSIS — M5431 Sciatica, right side: Secondary | ICD-10-CM | POA: Diagnosis not present

## 2016-10-29 DIAGNOSIS — M419 Scoliosis, unspecified: Secondary | ICD-10-CM | POA: Diagnosis not present

## 2016-10-29 DIAGNOSIS — Z6827 Body mass index (BMI) 27.0-27.9, adult: Secondary | ICD-10-CM | POA: Diagnosis not present

## 2016-11-02 DIAGNOSIS — I10 Essential (primary) hypertension: Secondary | ICD-10-CM | POA: Diagnosis not present

## 2016-11-02 DIAGNOSIS — E039 Hypothyroidism, unspecified: Secondary | ICD-10-CM | POA: Diagnosis not present

## 2016-11-02 DIAGNOSIS — J0100 Acute maxillary sinusitis, unspecified: Secondary | ICD-10-CM | POA: Diagnosis not present

## 2016-11-02 DIAGNOSIS — J069 Acute upper respiratory infection, unspecified: Secondary | ICD-10-CM | POA: Diagnosis not present

## 2016-11-06 DIAGNOSIS — M6281 Muscle weakness (generalized): Secondary | ICD-10-CM | POA: Diagnosis not present

## 2016-11-06 DIAGNOSIS — M545 Low back pain: Secondary | ICD-10-CM | POA: Diagnosis not present

## 2016-11-09 DIAGNOSIS — M5431 Sciatica, right side: Secondary | ICD-10-CM | POA: Diagnosis not present

## 2016-11-11 DIAGNOSIS — M6281 Muscle weakness (generalized): Secondary | ICD-10-CM | POA: Diagnosis not present

## 2016-11-11 DIAGNOSIS — M545 Low back pain: Secondary | ICD-10-CM | POA: Diagnosis not present

## 2016-11-26 DIAGNOSIS — I6523 Occlusion and stenosis of bilateral carotid arteries: Secondary | ICD-10-CM | POA: Diagnosis not present

## 2016-12-07 DIAGNOSIS — M5431 Sciatica, right side: Secondary | ICD-10-CM | POA: Diagnosis not present

## 2017-01-04 DIAGNOSIS — M4316 Spondylolisthesis, lumbar region: Secondary | ICD-10-CM | POA: Diagnosis not present

## 2017-01-04 DIAGNOSIS — Z6827 Body mass index (BMI) 27.0-27.9, adult: Secondary | ICD-10-CM | POA: Diagnosis not present

## 2017-01-04 DIAGNOSIS — M5431 Sciatica, right side: Secondary | ICD-10-CM | POA: Diagnosis not present

## 2017-01-04 DIAGNOSIS — M419 Scoliosis, unspecified: Secondary | ICD-10-CM | POA: Diagnosis not present

## 2017-03-02 DIAGNOSIS — X32XXXD Exposure to sunlight, subsequent encounter: Secondary | ICD-10-CM | POA: Diagnosis not present

## 2017-03-02 DIAGNOSIS — D225 Melanocytic nevi of trunk: Secondary | ICD-10-CM | POA: Diagnosis not present

## 2017-03-02 DIAGNOSIS — L570 Actinic keratosis: Secondary | ICD-10-CM | POA: Diagnosis not present

## 2017-03-23 DIAGNOSIS — I10 Essential (primary) hypertension: Secondary | ICD-10-CM | POA: Diagnosis not present

## 2017-03-23 DIAGNOSIS — I2510 Atherosclerotic heart disease of native coronary artery without angina pectoris: Secondary | ICD-10-CM | POA: Diagnosis not present

## 2017-03-23 DIAGNOSIS — Z0001 Encounter for general adult medical examination with abnormal findings: Secondary | ICD-10-CM | POA: Diagnosis not present

## 2017-03-23 DIAGNOSIS — Z86012 Personal history of benign carcinoid tumor: Secondary | ICD-10-CM | POA: Diagnosis not present

## 2017-03-23 DIAGNOSIS — Z79899 Other long term (current) drug therapy: Secondary | ICD-10-CM | POA: Diagnosis not present

## 2017-03-23 DIAGNOSIS — E7800 Pure hypercholesterolemia, unspecified: Secondary | ICD-10-CM | POA: Diagnosis not present

## 2017-03-23 DIAGNOSIS — E039 Hypothyroidism, unspecified: Secondary | ICD-10-CM | POA: Diagnosis not present

## 2017-06-02 DIAGNOSIS — I6523 Occlusion and stenosis of bilateral carotid arteries: Secondary | ICD-10-CM | POA: Diagnosis not present

## 2017-06-10 DIAGNOSIS — I6523 Occlusion and stenosis of bilateral carotid arteries: Secondary | ICD-10-CM | POA: Diagnosis not present

## 2017-06-10 DIAGNOSIS — I350 Nonrheumatic aortic (valve) stenosis: Secondary | ICD-10-CM | POA: Diagnosis not present

## 2017-06-10 DIAGNOSIS — I25119 Atherosclerotic heart disease of native coronary artery with unspecified angina pectoris: Secondary | ICD-10-CM | POA: Diagnosis not present

## 2017-06-16 DIAGNOSIS — I350 Nonrheumatic aortic (valve) stenosis: Secondary | ICD-10-CM | POA: Diagnosis not present

## 2017-06-24 ENCOUNTER — Other Ambulatory Visit (HOSPITAL_BASED_OUTPATIENT_CLINIC_OR_DEPARTMENT_OTHER): Payer: Commercial Managed Care - HMO

## 2017-06-24 ENCOUNTER — Ambulatory Visit (HOSPITAL_BASED_OUTPATIENT_CLINIC_OR_DEPARTMENT_OTHER): Payer: Self-pay | Admitting: Oncology

## 2017-06-24 ENCOUNTER — Telehealth: Payer: Self-pay | Admitting: *Deleted

## 2017-06-24 DIAGNOSIS — C7A011 Malignant carcinoid tumor of the jejunum: Secondary | ICD-10-CM

## 2017-06-24 NOTE — Telephone Encounter (Signed)
Pt was unaware of office visit today. Informed him we will reschedule for one week after we receive 24 hour urine result. He understands to expect call from schedulers.

## 2017-06-24 NOTE — Progress Notes (Signed)
Error, patient not seen 06/24/2017

## 2017-06-25 ENCOUNTER — Telehealth: Payer: Self-pay | Admitting: Oncology

## 2017-06-25 NOTE — Telephone Encounter (Signed)
Scheduled appt per 8/2 sch message - left message with appt time and date. And sent reminder letter in the mail.

## 2017-06-28 DIAGNOSIS — C7A011 Malignant carcinoid tumor of the jejunum: Secondary | ICD-10-CM | POA: Diagnosis not present

## 2017-06-28 DIAGNOSIS — I10 Essential (primary) hypertension: Secondary | ICD-10-CM | POA: Diagnosis not present

## 2017-06-28 LAB — CHROMOGRANIN A: Chromogranin A: 3 nmol/L (ref 0–5)

## 2017-07-01 LAB — 5 HIAA, QUANTITATIVE, URINE, 24 HOUR
5-HIAA, Urine: 3.3 mg/L
5-HIAA,Quant.,24 Hr Urine: 5 mg/24 hr (ref 0.0–14.9)

## 2017-07-02 ENCOUNTER — Telehealth: Payer: Self-pay | Admitting: *Deleted

## 2017-07-02 NOTE — Telephone Encounter (Signed)
Telephone call to patient- advised lab results as directed below. Patient verbalized an understanding and confirms Monday's appt.

## 2017-07-02 NOTE — Telephone Encounter (Signed)
-----   Message from Matthew Pier, MD sent at 07/01/2017  5:28 PM EDT ----- Please call patient, chromogranin and 5-HIAA are normal, f/u as scheduled

## 2017-07-05 ENCOUNTER — Telehealth: Payer: Self-pay | Admitting: Oncology

## 2017-07-05 ENCOUNTER — Ambulatory Visit: Payer: Medicare HMO

## 2017-07-05 ENCOUNTER — Ambulatory Visit (HOSPITAL_BASED_OUTPATIENT_CLINIC_OR_DEPARTMENT_OTHER): Payer: Medicare HMO | Admitting: Nurse Practitioner

## 2017-07-05 VITALS — BP 123/59 | HR 50 | Temp 97.7°F | Resp 17 | Ht 69.0 in | Wt 184.3 lb

## 2017-07-05 DIAGNOSIS — C7A011 Malignant carcinoid tumor of the jejunum: Secondary | ICD-10-CM | POA: Diagnosis not present

## 2017-07-05 NOTE — Telephone Encounter (Signed)
Gave patient avs and calendar for upcoming appts.  °

## 2017-07-05 NOTE — Progress Notes (Signed)
  [place] OFFICE PROGRESS NOTE   Diagnosis:  Carcinoid tumor  INTERVAL HISTORY:   [name] returns as scheduled. No diarrhea. No flushing. About 3 months ago he had an episode of constipation. He took a laxative with good results. He has a good appetite. He denies any pain.  Objective:  Vital signs in last 24 hours:  Blood pressure (!) 123/59, pulse (!) 50, temperature 97.7 F (36.5 C), temperature source Oral, resp. rate 17, height 5\' 9"  (1.753 m), weight 184 lb 4.8 oz (83.6 kg), SpO2 99 %.    HEENT: Soft fullness right supraclavicular fossa. Lymphatics: No palpable cervical, supra clavicular, axillary or inguinal lymph nodes. Resp: Lungs clear bilaterally. Cardio: Regular rate and rhythm. 2/6 systolic murmur. GI: No hepatomegaly. No mass. Vascular: No leg edema.    Lab Results:  Lab Results  Component Value Date   WBC 6.3 07/07/2013   HGB 12.2 (L) 07/07/2013   HCT 35.4 (L) 07/07/2013   MCV 90.1 07/07/2013   PLT 153 07/07/2013   06/24/2017 chromogranin A- 3; today for her urine 5 HIAA- 5.0 Imaging:  No results found.  Medications: I have reviewed the patient's current medications.  Assessment/Plan: 1. 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 History of a partial small bowel obstruction secondary to the small bowel carcinoid tumor  3. Elevated preoperative chromogranin A and 24-hour urine 5 HIAA levels  4. Small lung nodules seen on the CT of the abdomen 12/23/2011-likely benign  5. Coronary artery disease, status post placement of a coronary stent August 2014    Disposition: Matthew Brennan remains in clinical remission from the carcinoid tumor. He will return for labs and a follow-up visit in one  year.    Ned Card ANP/GNP-BC   07/05/2017  2:44 PM

## 2017-07-20 DIAGNOSIS — L570 Actinic keratosis: Secondary | ICD-10-CM | POA: Diagnosis not present

## 2017-07-20 DIAGNOSIS — D1721 Benign lipomatous neoplasm of skin and subcutaneous tissue of right arm: Secondary | ICD-10-CM | POA: Diagnosis not present

## 2017-07-20 DIAGNOSIS — D2261 Melanocytic nevi of right upper limb, including shoulder: Secondary | ICD-10-CM | POA: Diagnosis not present

## 2017-07-20 DIAGNOSIS — L708 Other acne: Secondary | ICD-10-CM | POA: Diagnosis not present

## 2017-07-20 DIAGNOSIS — X32XXXD Exposure to sunlight, subsequent encounter: Secondary | ICD-10-CM | POA: Diagnosis not present

## 2017-07-29 DIAGNOSIS — I25119 Atherosclerotic heart disease of native coronary artery with unspecified angina pectoris: Secondary | ICD-10-CM | POA: Diagnosis not present

## 2017-07-29 DIAGNOSIS — I129 Hypertensive chronic kidney disease with stage 1 through stage 4 chronic kidney disease, or unspecified chronic kidney disease: Secondary | ICD-10-CM | POA: Diagnosis not present

## 2017-07-29 DIAGNOSIS — N182 Chronic kidney disease, stage 2 (mild): Secondary | ICD-10-CM | POA: Diagnosis not present

## 2017-07-29 DIAGNOSIS — I6523 Occlusion and stenosis of bilateral carotid arteries: Secondary | ICD-10-CM | POA: Diagnosis not present

## 2017-10-13 DIAGNOSIS — Z79899 Other long term (current) drug therapy: Secondary | ICD-10-CM | POA: Diagnosis not present

## 2017-10-13 DIAGNOSIS — I2510 Atherosclerotic heart disease of native coronary artery without angina pectoris: Secondary | ICD-10-CM | POA: Diagnosis not present

## 2017-10-13 DIAGNOSIS — E039 Hypothyroidism, unspecified: Secondary | ICD-10-CM | POA: Diagnosis not present

## 2017-10-13 DIAGNOSIS — Z23 Encounter for immunization: Secondary | ICD-10-CM | POA: Diagnosis not present

## 2017-10-13 DIAGNOSIS — I10 Essential (primary) hypertension: Secondary | ICD-10-CM | POA: Diagnosis not present

## 2017-10-13 DIAGNOSIS — E7800 Pure hypercholesterolemia, unspecified: Secondary | ICD-10-CM | POA: Diagnosis not present

## 2017-10-13 DIAGNOSIS — M25473 Effusion, unspecified ankle: Secondary | ICD-10-CM | POA: Diagnosis not present

## 2018-01-26 DIAGNOSIS — I25119 Atherosclerotic heart disease of native coronary artery with unspecified angina pectoris: Secondary | ICD-10-CM | POA: Diagnosis not present

## 2018-01-26 DIAGNOSIS — N182 Chronic kidney disease, stage 2 (mild): Secondary | ICD-10-CM | POA: Diagnosis not present

## 2018-01-26 DIAGNOSIS — I6523 Occlusion and stenosis of bilateral carotid arteries: Secondary | ICD-10-CM | POA: Diagnosis not present

## 2018-01-26 DIAGNOSIS — I129 Hypertensive chronic kidney disease with stage 1 through stage 4 chronic kidney disease, or unspecified chronic kidney disease: Secondary | ICD-10-CM | POA: Diagnosis not present

## 2018-02-17 DIAGNOSIS — I6523 Occlusion and stenosis of bilateral carotid arteries: Secondary | ICD-10-CM | POA: Diagnosis not present

## 2018-03-07 DIAGNOSIS — I25119 Atherosclerotic heart disease of native coronary artery with unspecified angina pectoris: Secondary | ICD-10-CM | POA: Diagnosis not present

## 2018-03-07 DIAGNOSIS — N182 Chronic kidney disease, stage 2 (mild): Secondary | ICD-10-CM | POA: Diagnosis not present

## 2018-03-07 DIAGNOSIS — I6523 Occlusion and stenosis of bilateral carotid arteries: Secondary | ICD-10-CM | POA: Diagnosis not present

## 2018-03-07 DIAGNOSIS — I129 Hypertensive chronic kidney disease with stage 1 through stage 4 chronic kidney disease, or unspecified chronic kidney disease: Secondary | ICD-10-CM | POA: Diagnosis not present

## 2018-04-07 DIAGNOSIS — I10 Essential (primary) hypertension: Secondary | ICD-10-CM | POA: Diagnosis not present

## 2018-04-07 DIAGNOSIS — M47816 Spondylosis without myelopathy or radiculopathy, lumbar region: Secondary | ICD-10-CM | POA: Diagnosis not present

## 2018-04-07 DIAGNOSIS — Z6827 Body mass index (BMI) 27.0-27.9, adult: Secondary | ICD-10-CM | POA: Diagnosis not present

## 2018-04-07 DIAGNOSIS — M7918 Myalgia, other site: Secondary | ICD-10-CM | POA: Diagnosis not present

## 2018-04-13 DIAGNOSIS — E039 Hypothyroidism, unspecified: Secondary | ICD-10-CM | POA: Diagnosis not present

## 2018-04-13 DIAGNOSIS — Z79899 Other long term (current) drug therapy: Secondary | ICD-10-CM | POA: Diagnosis not present

## 2018-04-13 DIAGNOSIS — I2510 Atherosclerotic heart disease of native coronary artery without angina pectoris: Secondary | ICD-10-CM | POA: Diagnosis not present

## 2018-04-13 DIAGNOSIS — I10 Essential (primary) hypertension: Secondary | ICD-10-CM | POA: Diagnosis not present

## 2018-04-13 DIAGNOSIS — Z86012 Personal history of benign carcinoid tumor: Secondary | ICD-10-CM | POA: Diagnosis not present

## 2018-04-13 DIAGNOSIS — E7800 Pure hypercholesterolemia, unspecified: Secondary | ICD-10-CM | POA: Diagnosis not present

## 2018-05-02 DIAGNOSIS — M47816 Spondylosis without myelopathy or radiculopathy, lumbar region: Secondary | ICD-10-CM | POA: Diagnosis not present

## 2018-05-18 DIAGNOSIS — M47816 Spondylosis without myelopathy or radiculopathy, lumbar region: Secondary | ICD-10-CM | POA: Diagnosis not present

## 2018-06-28 ENCOUNTER — Inpatient Hospital Stay: Payer: Medicare HMO | Attending: Oncology

## 2018-06-28 DIAGNOSIS — Z8719 Personal history of other diseases of the digestive system: Secondary | ICD-10-CM | POA: Insufficient documentation

## 2018-06-28 DIAGNOSIS — C7A011 Malignant carcinoid tumor of the jejunum: Secondary | ICD-10-CM

## 2018-06-28 DIAGNOSIS — R918 Other nonspecific abnormal finding of lung field: Secondary | ICD-10-CM | POA: Insufficient documentation

## 2018-06-28 DIAGNOSIS — R609 Edema, unspecified: Secondary | ICD-10-CM | POA: Diagnosis not present

## 2018-06-28 DIAGNOSIS — M419 Scoliosis, unspecified: Secondary | ICD-10-CM | POA: Insufficient documentation

## 2018-06-28 DIAGNOSIS — I2510 Atherosclerotic heart disease of native coronary artery without angina pectoris: Secondary | ICD-10-CM | POA: Diagnosis not present

## 2018-06-28 DIAGNOSIS — G8929 Other chronic pain: Secondary | ICD-10-CM | POA: Diagnosis not present

## 2018-06-28 DIAGNOSIS — Z85030 Personal history of malignant carcinoid tumor of large intestine: Secondary | ICD-10-CM | POA: Diagnosis not present

## 2018-06-29 LAB — CHROMOGRANIN A: Chromogranin A: 3 nmol/L (ref 0–5)

## 2018-06-30 DIAGNOSIS — C7A011 Malignant carcinoid tumor of the jejunum: Secondary | ICD-10-CM | POA: Diagnosis not present

## 2018-06-30 DIAGNOSIS — M419 Scoliosis, unspecified: Secondary | ICD-10-CM | POA: Diagnosis not present

## 2018-06-30 DIAGNOSIS — Z8719 Personal history of other diseases of the digestive system: Secondary | ICD-10-CM | POA: Diagnosis not present

## 2018-06-30 DIAGNOSIS — I2510 Atherosclerotic heart disease of native coronary artery without angina pectoris: Secondary | ICD-10-CM | POA: Diagnosis not present

## 2018-06-30 DIAGNOSIS — R609 Edema, unspecified: Secondary | ICD-10-CM | POA: Diagnosis not present

## 2018-06-30 DIAGNOSIS — G8929 Other chronic pain: Secondary | ICD-10-CM | POA: Diagnosis not present

## 2018-06-30 DIAGNOSIS — Z85030 Personal history of malignant carcinoid tumor of large intestine: Secondary | ICD-10-CM | POA: Diagnosis not present

## 2018-06-30 DIAGNOSIS — R918 Other nonspecific abnormal finding of lung field: Secondary | ICD-10-CM | POA: Diagnosis not present

## 2018-07-04 LAB — 5 HIAA, QUANTITATIVE, URINE, 24 HOUR
5-HIAA, Ur: 2.4 mg/L
5-HIAA,Quant.,24 Hr Urine: 6.4 mg/24 hr (ref 0.0–14.9)
Total Volume: 2650

## 2018-07-05 ENCOUNTER — Inpatient Hospital Stay: Payer: Medicare HMO | Admitting: Oncology

## 2018-07-05 ENCOUNTER — Telehealth: Payer: Self-pay

## 2018-07-05 VITALS — BP 147/66 | HR 49 | Temp 97.8°F | Resp 19 | Ht 69.0 in | Wt 176.8 lb

## 2018-07-05 DIAGNOSIS — M419 Scoliosis, unspecified: Secondary | ICD-10-CM | POA: Diagnosis not present

## 2018-07-05 DIAGNOSIS — I2510 Atherosclerotic heart disease of native coronary artery without angina pectoris: Secondary | ICD-10-CM | POA: Diagnosis not present

## 2018-07-05 DIAGNOSIS — R609 Edema, unspecified: Secondary | ICD-10-CM | POA: Diagnosis not present

## 2018-07-05 DIAGNOSIS — C7A011 Malignant carcinoid tumor of the jejunum: Secondary | ICD-10-CM

## 2018-07-05 DIAGNOSIS — Z8719 Personal history of other diseases of the digestive system: Secondary | ICD-10-CM | POA: Diagnosis not present

## 2018-07-05 DIAGNOSIS — R918 Other nonspecific abnormal finding of lung field: Secondary | ICD-10-CM | POA: Diagnosis not present

## 2018-07-05 DIAGNOSIS — Z85030 Personal history of malignant carcinoid tumor of large intestine: Secondary | ICD-10-CM

## 2018-07-05 DIAGNOSIS — G8929 Other chronic pain: Secondary | ICD-10-CM

## 2018-07-05 NOTE — Progress Notes (Signed)
  Matthew Brennan OFFICE PROGRESS NOTE   Diagnosis: Carcinoid tumor  INTERVAL HISTORY:   Matthew Brennan returns as scheduled.  He feels well.  Good appetite and energy level.  No diarrhea or flushing.  He has chronic back pain that he relates to scoliosis.  Objective:  Vital signs in last 24 hours:  Blood pressure (!) 147/66, pulse (!) 49, temperature 97.8 F (36.6 C), temperature source Oral, resp. rate 19, height 5\' 9"  (1.753 m), weight 176 lb 12.8 oz (80.2 kg), SpO2 100 %.    HEENT: Neck without mass Lymphatics: No cervical, supraclavicular, or axillary nodes Resp: Clear bilaterally Cardio: Regular rate and rhythm, 2/6 systolic murmur GI: No hepatospleno megaly, no mass, nontender Vascular: Trace edema at the left greater than right lower leg   Lab Results:   06/28/2018: Chromogranin A- 324-hour urine 5 HIAA-6.4  Medications: I have reviewed the patient's current medications.   Assessment/Plan: 1. 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 History of a partial small bowel obstruction secondary to the small bowel carcinoid tumor  3. Elevated preoperative chromogranin A and 24-hour urine 5 HIAA levels  4. Small lung nodules seen on the CT of the abdomen 12/23/2011-likely benign  5. Coronary artery disease, status post placement of a coronary stent August 2014     Disposition: Matthew Brennan remains in clinical remission from the carcinoid tumors.  The chromogranin A and 24-hour urine 5-HIAA are normal.  He would like to continue follow-up at the Cancer center.  He will return for an office visit and repeat tumor markers in 1 year.  15 minutes were spent with the patient today.  The majority of the time was used for  counseling and coordination of care.  Betsy Coder, MD  07/05/2018  1:16 PM

## 2018-07-05 NOTE — Telephone Encounter (Addendum)
Pt voiced understanding of message below ----- Message from Ladell Pier, MD sent at 06/30/2018  5:41 PM EDT ----- Please call patient, chromogranin level is normal

## 2018-07-06 ENCOUNTER — Telehealth: Payer: Self-pay | Admitting: Oncology

## 2018-07-06 NOTE — Telephone Encounter (Signed)
Scheduled appt per  8/13 los - sent reminder letter in the mail with appt date and time.  

## 2018-07-19 DIAGNOSIS — M47816 Spondylosis without myelopathy or radiculopathy, lumbar region: Secondary | ICD-10-CM | POA: Diagnosis not present

## 2018-08-16 DIAGNOSIS — M4316 Spondylolisthesis, lumbar region: Secondary | ICD-10-CM | POA: Diagnosis not present

## 2018-08-16 DIAGNOSIS — M47816 Spondylosis without myelopathy or radiculopathy, lumbar region: Secondary | ICD-10-CM | POA: Diagnosis not present

## 2018-08-16 DIAGNOSIS — M419 Scoliosis, unspecified: Secondary | ICD-10-CM | POA: Diagnosis not present

## 2018-09-05 DIAGNOSIS — R001 Bradycardia, unspecified: Secondary | ICD-10-CM | POA: Diagnosis not present

## 2018-09-05 DIAGNOSIS — I25119 Atherosclerotic heart disease of native coronary artery with unspecified angina pectoris: Secondary | ICD-10-CM | POA: Diagnosis not present

## 2018-09-05 DIAGNOSIS — I6523 Occlusion and stenosis of bilateral carotid arteries: Secondary | ICD-10-CM | POA: Diagnosis not present

## 2018-09-05 DIAGNOSIS — I129 Hypertensive chronic kidney disease with stage 1 through stage 4 chronic kidney disease, or unspecified chronic kidney disease: Secondary | ICD-10-CM | POA: Diagnosis not present

## 2018-09-16 DIAGNOSIS — I6523 Occlusion and stenosis of bilateral carotid arteries: Secondary | ICD-10-CM | POA: Diagnosis not present

## 2018-10-19 DIAGNOSIS — I10 Essential (primary) hypertension: Secondary | ICD-10-CM | POA: Diagnosis not present

## 2018-10-19 DIAGNOSIS — I2510 Atherosclerotic heart disease of native coronary artery without angina pectoris: Secondary | ICD-10-CM | POA: Diagnosis not present

## 2018-10-19 DIAGNOSIS — E7800 Pure hypercholesterolemia, unspecified: Secondary | ICD-10-CM | POA: Diagnosis not present

## 2018-10-19 DIAGNOSIS — Z79899 Other long term (current) drug therapy: Secondary | ICD-10-CM | POA: Diagnosis not present

## 2018-10-19 DIAGNOSIS — E039 Hypothyroidism, unspecified: Secondary | ICD-10-CM | POA: Diagnosis not present

## 2018-11-24 DIAGNOSIS — R001 Bradycardia, unspecified: Secondary | ICD-10-CM | POA: Diagnosis not present

## 2018-11-24 DIAGNOSIS — I6523 Occlusion and stenosis of bilateral carotid arteries: Secondary | ICD-10-CM | POA: Diagnosis not present

## 2018-11-24 DIAGNOSIS — I129 Hypertensive chronic kidney disease with stage 1 through stage 4 chronic kidney disease, or unspecified chronic kidney disease: Secondary | ICD-10-CM | POA: Diagnosis not present

## 2018-11-24 DIAGNOSIS — I2510 Atherosclerotic heart disease of native coronary artery without angina pectoris: Secondary | ICD-10-CM | POA: Diagnosis not present

## 2018-12-23 DIAGNOSIS — I2510 Atherosclerotic heart disease of native coronary artery without angina pectoris: Secondary | ICD-10-CM | POA: Diagnosis not present

## 2018-12-23 DIAGNOSIS — R001 Bradycardia, unspecified: Secondary | ICD-10-CM | POA: Diagnosis not present

## 2018-12-23 DIAGNOSIS — N182 Chronic kidney disease, stage 2 (mild): Secondary | ICD-10-CM | POA: Diagnosis not present

## 2018-12-23 DIAGNOSIS — X32XXXD Exposure to sunlight, subsequent encounter: Secondary | ICD-10-CM | POA: Diagnosis not present

## 2018-12-23 DIAGNOSIS — L570 Actinic keratosis: Secondary | ICD-10-CM | POA: Diagnosis not present

## 2018-12-23 DIAGNOSIS — I129 Hypertensive chronic kidney disease with stage 1 through stage 4 chronic kidney disease, or unspecified chronic kidney disease: Secondary | ICD-10-CM | POA: Diagnosis not present

## 2019-01-02 ENCOUNTER — Other Ambulatory Visit: Payer: Self-pay | Admitting: Cardiology

## 2019-01-02 DIAGNOSIS — I10 Essential (primary) hypertension: Secondary | ICD-10-CM | POA: Diagnosis not present

## 2019-01-03 LAB — BASIC METABOLIC PANEL
BUN/Creatinine Ratio: 32 — ABNORMAL HIGH (ref 10–24)
BUN: 42 mg/dL — ABNORMAL HIGH (ref 8–27)
CO2: 20 mmol/L (ref 20–29)
Calcium: 9.1 mg/dL (ref 8.6–10.2)
Chloride: 108 mmol/L — ABNORMAL HIGH (ref 96–106)
Creatinine, Ser: 1.33 mg/dL — ABNORMAL HIGH (ref 0.76–1.27)
GFR calc Af Amer: 56 mL/min/{1.73_m2} — ABNORMAL LOW (ref >59)
GFR calc non Af Amer: 49 mL/min/{1.73_m2} — ABNORMAL LOW (ref >59)
Glucose: 91 mg/dL (ref 65–99)
Potassium: 4.5 mmol/L (ref 3.5–5.2)
Sodium: 141 mmol/L (ref 134–144)

## 2019-01-04 ENCOUNTER — Telehealth: Payer: Self-pay

## 2019-01-04 NOTE — Telephone Encounter (Signed)
Left a message for pt stating lab results are normal

## 2019-01-04 NOTE — Telephone Encounter (Signed)
-----   Message from Miquel Dunn, NP sent at 01/04/2019  8:44 AM EST ----- Let patient know that kidney function is stable.

## 2019-01-12 ENCOUNTER — Other Ambulatory Visit: Payer: Self-pay | Admitting: Oncology

## 2019-01-12 DIAGNOSIS — C7A011 Malignant carcinoid tumor of the jejunum: Secondary | ICD-10-CM

## 2019-01-14 ENCOUNTER — Other Ambulatory Visit: Payer: Self-pay | Admitting: Cardiology

## 2019-01-14 DIAGNOSIS — I10 Essential (primary) hypertension: Secondary | ICD-10-CM

## 2019-01-20 ENCOUNTER — Ambulatory Visit: Payer: Self-pay | Admitting: Cardiology

## 2019-01-23 ENCOUNTER — Ambulatory Visit: Payer: Self-pay | Admitting: Cardiology

## 2019-01-25 ENCOUNTER — Other Ambulatory Visit: Payer: Self-pay | Admitting: Cardiology

## 2019-01-25 DIAGNOSIS — I6523 Occlusion and stenosis of bilateral carotid arteries: Secondary | ICD-10-CM

## 2019-02-09 ENCOUNTER — Other Ambulatory Visit: Payer: Self-pay | Admitting: Cardiology

## 2019-02-09 DIAGNOSIS — I10 Essential (primary) hypertension: Secondary | ICD-10-CM

## 2019-02-22 ENCOUNTER — Ambulatory Visit: Payer: Self-pay | Admitting: Cardiology

## 2019-03-07 ENCOUNTER — Other Ambulatory Visit: Payer: Self-pay

## 2019-03-07 DIAGNOSIS — I10 Essential (primary) hypertension: Secondary | ICD-10-CM

## 2019-03-07 MED ORDER — VALSARTAN-HYDROCHLOROTHIAZIDE 320-12.5 MG PO TABS: 1.0000 | ORAL_TABLET | Freq: Every day | ORAL | 1 refills | Status: DC

## 2019-03-21 ENCOUNTER — Other Ambulatory Visit: Payer: Self-pay

## 2019-04-20 DIAGNOSIS — M47816 Spondylosis without myelopathy or radiculopathy, lumbar region: Secondary | ICD-10-CM | POA: Diagnosis not present

## 2019-04-20 DIAGNOSIS — M4316 Spondylolisthesis, lumbar region: Secondary | ICD-10-CM | POA: Diagnosis not present

## 2019-04-20 DIAGNOSIS — M419 Scoliosis, unspecified: Secondary | ICD-10-CM | POA: Diagnosis not present

## 2019-05-03 ENCOUNTER — Ambulatory Visit: Payer: Medicare HMO

## 2019-05-03 ENCOUNTER — Other Ambulatory Visit: Payer: Self-pay

## 2019-05-03 DIAGNOSIS — I6523 Occlusion and stenosis of bilateral carotid arteries: Secondary | ICD-10-CM

## 2019-05-10 ENCOUNTER — Ambulatory Visit (INDEPENDENT_AMBULATORY_CARE_PROVIDER_SITE_OTHER): Payer: Medicare HMO | Admitting: Cardiology

## 2019-05-10 ENCOUNTER — Other Ambulatory Visit: Payer: Self-pay

## 2019-05-10 ENCOUNTER — Encounter: Payer: Self-pay | Admitting: Cardiology

## 2019-05-10 VITALS — BP 124/60 | HR 57 | Ht 66.0 in | Wt 172.0 lb

## 2019-05-10 DIAGNOSIS — I6523 Occlusion and stenosis of bilateral carotid arteries: Secondary | ICD-10-CM | POA: Diagnosis not present

## 2019-05-10 DIAGNOSIS — E7800 Pure hypercholesterolemia, unspecified: Secondary | ICD-10-CM | POA: Diagnosis not present

## 2019-05-10 DIAGNOSIS — R001 Bradycardia, unspecified: Secondary | ICD-10-CM | POA: Diagnosis not present

## 2019-05-10 DIAGNOSIS — I2510 Atherosclerotic heart disease of native coronary artery without angina pectoris: Secondary | ICD-10-CM

## 2019-05-10 DIAGNOSIS — I350 Nonrheumatic aortic (valve) stenosis: Secondary | ICD-10-CM

## 2019-05-10 DIAGNOSIS — I10 Essential (primary) hypertension: Secondary | ICD-10-CM

## 2019-05-10 NOTE — Progress Notes (Signed)
Primary Physician/Referring:  Lujean Amel, MD  Patient ID: Matthew Brennan, male    DOB: 05/01/1932, 85 y.o.   MRN: [id number]  Chief Complaint  Patient presents with  . Coronary Artery Disease    pt having spinal procedure next week  . Carotid    stenosis and ultrasound f/u    HPI: Matthew Brennan  is a 85 y.o. male  with  history of carcinoid syndrome, he has h/o partial small bowel resection in 2013, now being followed by oncology and told to be completely cured. He has very mild aortic stenosis, asymptomatic carotid stenosis, CAD s/p PCI to Cx with BMS in 2014, and chronic asymptomatic S. Bradycardia. He is having RF ablation for back pain for his right leg sciatica.   With regard to coronary artery disease, he has remained asymptomatic without recurrence of angina pectoris since angioplasty in 2014.  He continues to have marked underlying sinus bradycardia, asymptomatic and he is now off of any beta-blockers.  He also has asymptomatic bilateral carotid artery stenosis, right carotid artery stenosis is greater than 70% stenosed and is being closely monitored since 2018.  He has not had any visual disturbances, TIA-like symptoms.  Past Medical History:  Diagnosis Date  . Abdominal distension   . Abdominal pain   . Blood transfusion 1992   s/p GI bleed  . CAD (coronary artery disease)   . 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 Carcinoid tumor of intestine   . Diverticulitis   . Diverticulosis of colon without diverticulitis   . Dysrhythmia    palpitations from MVP occ  . ED (erectile dysfunction)   . Enlarged prostate with lower urinary tract symptoms (LUTS)   . Heart murmur   . Hx SBO   . Hypertension   . Hypothyroidism   . Mitral valve prolapse    last visit w/ cardiologist 1999 in [place]  . Obesity   . Pneumonia    as a child  . Recurrent upper respiratory infection (URI)   . Testosterone deficiency   . Thyroid disease    hypothyroidism     Past Surgical History:  Procedure Laterality Date  . BOWEL RESECTION  02/17/2012   Procedure: SMALL BOWEL RESECTION;  Surgeon: Adin Hector, MD;  Location: [facility];  Service: General;;  small bowel resection and wedge resection small  bowel mass.  . exploritory laperotmy with small bowel resection  02/17/2012  . EYE SURGERY  2000,2003   cataract ext/iol bilateral  . LAPAROTOMY  02/17/2012   Procedure: EXPLORATORY LAPAROTOMY;  Surgeon: Adin Hector, MD;  Location: [facility];  Service: General;  Laterality: N/A;  . LEFT HEART CATHETERIZATION WITH CORONARY ANGIOGRAM N/A 07/06/2013   Procedure: LEFT HEART CATHETERIZATION WITH CORONARY ANGIOGRAM;  Surgeon: Laverda Page, MD;  Location: [facility] CATH LAB;  Service: Cardiovascular;  Laterality: N/A;  . MOUTH SURGERY  [phone] 2004  . NO PAST SURGERIES    . no prior surgeries      Social History   Socioeconomic History  . Marital status: Married    Spouse name: Not on file  . Number of children: Not on file  . Years of education: Not on file  . Highest education level: Not on file  Occupational History  . Not on file  Social Needs  . Financial resource strain: Not on file  . Food insecurity    Worry: Not on file    Inability: Not on file  . Transportation  needs    Medical: Not on file    Non-medical: Not on file  Tobacco Use  . Smoking status: Former Smoker    Packs/day: 4.00    Years: 5.00    Pack years: 20.00    Types: Cigarettes    Quit date: 01/06/1962    Years since quitting: 57.3  . Smokeless tobacco: Never Used  Substance and Sexual Activity  . Alcohol use: No  . Drug use: No  . Sexual activity: Yes  Lifestyle  . Physical activity    Days per week: Not on file    Minutes per session: Not on file  . Stress: Not on file  Relationships  . Social Herbalist on phone: Not on file    Gets together: Not on file    Attends religious service: Not on file    Active member of club or organization: Not on file     Attends meetings of clubs or organizations: Not on file    Relationship status: Not on file  . Intimate partner violence    Fear of current or ex partner: Not on file    Emotionally abused: Not on file    Physically abused: Not on file    Forced sexual activity: Not on file  Other Topics Concern  . Not on file  Social History Narrative  . Not on file   Review of Systems  Constitution: Negative for chills, decreased appetite, malaise/fatigue and weight gain.  HENT: Nosebleeds: chronic and mild.   Cardiovascular: Positive for leg swelling. Negative for dyspnea on exertion and syncope.  Endocrine: Negative for cold intolerance.  Hematologic/Lymphatic: Does not bruise/bleed easily.  Musculoskeletal: Positive for back pain (chronic) and muscle cramps (righ leg sciatica). Negative for joint swelling.  Gastrointestinal: Negative for abdominal pain, anorexia, change in bowel habit, constipation, hematochezia and melena.  Neurological: Negative for headaches and light-headedness.  Psychiatric/Behavioral: Negative for depression and substance abuse.  All other systems reviewed and are negative.     Objective  Blood pressure 124/60, pulse (!) 57, height 5\' 6"  (1.676 m), weight 172 lb (78 kg). Body mass index is 27.76 kg/m.    Physical Exam  Constitutional: He appears well-developed and well-nourished. No distress.  HENT:  Head: Atraumatic.  Eyes: Conjunctivae are normal.  Neck: Neck supple. No JVD present. No thyromegaly present.  Cardiovascular: Normal rate, regular rhythm, S1 normal, S2 normal and intact distal pulses. Exam reveals no gallop.  Murmur heard.  Harsh midsystolic murmur is present with a grade of 2/6 at the upper right sternal border. Pulses:      Carotid pulses are on the right side with bruit and on the left side with bruit. Trace leg edema  Pulmonary/Chest: Effort normal and breath sounds normal.  Abdominal: Soft. Bowel sounds are normal.  Musculoskeletal: Normal  range of motion.        General: No edema.  Neurological: He is alert.  Skin: Skin is warm and dry.  Psychiatric: He has a normal mood and affect.   Radiology: No results found.  Laboratory examination:    CMP Latest Ref Rng & Units 01/02/2019 07/07/2013 07/05/2013  Glucose 65 - 99 mg/dL 91 99 101(H)  BUN 8 - 27 mg/dL 42(H) 22 31(H)  Creatinine 0.76 - 1.27 mg/dL 1.33(H) 1.09 1.50(H)  Sodium 134 - 144 mmol/L 141 141 138  Potassium 3.5 - 5.2 mmol/L 4.5 3.8 4.1  Chloride 96 - 106 mmol/L 108(H) 108 102  CO2 20 - 29  mmol/L 20 22 25   Calcium 8.6 - 10.2 mg/dL 9.1 8.8 9.2  Total Protein 6.0 - 8.3 g/dL - - -  Total Bilirubin 0.3 - 1.2 mg/dL - - -  Alkaline Phos 39 - 117 U/L - - -  AST 0 - 37 U/L - - -  ALT 0 - 53 U/L - - -   CBC Latest Ref Rng & Units 07/07/2013 07/06/2013 07/05/2013  WBC 4.0 - 10.5 K/uL 6.3 5.9 7.9  Hemoglobin 13.0 - 17.0 g/dL 12.2(L) 12.4(L) 14.0  Hematocrit 39.0 - 52.0 % 35.4(L) 36.0(L) 38.7(L)  Platelets 150 - 400 K/uL 153 162 204   Lipid Panel  No results found for: CHOL, TRIG, HDL, CHOLHDL, VLDL, LDLCALC, LDLDIRECT HEMOGLOBIN A1C No results found for: HGBA1C, MPG TSH No results for input(s): TSH in the last 8760 hours.   Medications   There are no discontinued medications. Current Meds  Medication Sig  . ARMOUR THYROID 15 MG tablet Take 45 mg by mouth daily. Takes total of 45mg  daily  . Ascorbic Acid (VITAMIN C) 1000 MG tablet Take 3,000 mg by mouth daily.   Marland Kitchen aspirin 81 MG tablet Take 1 tablet (81 mg total) by mouth daily. (Patient taking differently: Take 81 mg by mouth daily. 2 tabs at night, 1 in AM)  . atorvastatin (LIPITOR) 10 MG tablet Take 1 tablet (10 mg total) by mouth daily at 6 PM.  . Black Pepper-Turmeric (TURMERIC CURCUMIN) 03-999 MG CAPS Take by mouth.  . Melatonin 10 MG TABS Take 10 mg by mouth daily.  . Multiple Vitamins-Minerals (MULTIVITAL PO) Take 1 capsule by mouth daily. One-A-Day  . valsartan-hydrochlorothiazide (DIOVAN-HCT) 320-12.5  MG tablet Take 1 tablet by mouth daily.  . vitamin E 1000 UNIT capsule Take 1,000 Units by mouth daily.   Cardiac Studies:   Carotid artery duplex  05/03/2019: Severe stenosis in the right internal carotid artery (>=70%). The right PSV internal/common carotid artery ratio of 9.6 is consistent with a stenosis of >70%. Peak velocity 408/105 cm/s. Stenosis in the right external carotid artery (<50%). Stenosis in the left internal carotid artery (50-69%). Antegrade right vertebral artery flow. Antegrade left vertebral artery flow. No significant change since 09/16/2018, 02/17/2018 and 11/29/2016. Follow up in six months is appropriate if clinically indicated.  Echocardiogram 06/16/2017: Left ventricle cavity is normal in size. Moderate concentric hypertrophy of the left ventricle. Normal global wall motion. Normal diastolic filling pattern. Calculated EF 65%. Trileaflet aortic valve with mild to moderate regurgitation. Mild calcification of the aortic valve annulus. Moderate aortic valve leaflet calcification. Moderately restricted aortic valve leaflets. Mild aortic valve stenosis. Aortic valve peak pressure gradient of 19.4 and mean gradient of 9.5 mmHg, calculated aortic valve area 1.23 cm. Mild to moderate mitral regurgitation. Mild to moderate tricuspid regurgitation. No evidence of pulmonary hypertension. Compared to 07/27/2013, AI, MR and TR was mild. Otherwise no significant change.  Coronary angiogram 07/06/2013: Large circumflex, 99% stenosis. Successful stenting a 4.0 x 16 mm Rebel non-drug-eluting stent. Mild disease in other vessels.  Assessment   Coronary artery disease involving native coronary artery of native heart without angina pectoris - 07/06/2013: Large circumflex, 99% stenosis S/P 4.0 x 16 mm Rebel non-drug-eluting stent.  Asymptomatic bilateral carotid artery stenosis  Bradycardia on ECG   Essential hypertension - Plan: TSH  Hypercholesteremia - Plan: Lipid Panel With  LDL/HDL Ratio  Mild aortic stenosis - Plan: PCV ECHOCARDIOGRAM COMPLETE    EKG 11/24/2018: Marked sinus bradycardia at the rate of 43 bpm, normal axis,  no evidence of ischemia, normal QT interval. Otherwise normal EKG. No significant change from EKG 09/05/2018  Recommendations:   Patient with known coronary artery disease who remains asymptomatic without angina pectoris.  Asymptomatic marked sinus bradycardia.  Not on a beta-blocker.  He does have chronic stage III kidney disease, on his last office visit in January 2020, valsartan dose was increased to 320/12.5 mg every morning, renal function has remained stable.    His aortic stenosis severity on exam appeared to be stable when I saw him in January 2020.  It may also be worthwhile to recheck his carotid and echocardiogram the next 6 months prior to his office visit.  Carotid artery stenosis severity has remained stable with greater than 70 probably 80% stenosis in the right ICA but no change since 2018.  As he remains asymptomatic, we could certainly continue watching what we are consider CTA to better define the severity of the stenosis.  With regard to hyperlipidemia, I will repeat TSH and also lipid profile testing.  He is going for laser ablation of spinal spur leading to right leg neurogenic claudication.  Advised him to hold off on aspirin for 1 week prior to the procedure.  Adrian Prows, MD, [facility] 05/11/2019, 6:20 AM [facility] [place] Pager: [phone] Office: [phone] If no answer Cell [phone]

## 2019-05-11 ENCOUNTER — Encounter: Payer: Self-pay | Admitting: Cardiology

## 2019-05-15 DIAGNOSIS — M47816 Spondylosis without myelopathy or radiculopathy, lumbar region: Secondary | ICD-10-CM | POA: Diagnosis not present

## 2019-06-29 ENCOUNTER — Other Ambulatory Visit: Payer: Self-pay

## 2019-06-29 ENCOUNTER — Inpatient Hospital Stay: Payer: Medicare HMO | Attending: Oncology

## 2019-06-29 DIAGNOSIS — C7A011 Malignant carcinoid tumor of the jejunum: Secondary | ICD-10-CM

## 2019-06-29 DIAGNOSIS — Z85038 Personal history of other malignant neoplasm of large intestine: Secondary | ICD-10-CM | POA: Insufficient documentation

## 2019-07-03 ENCOUNTER — Other Ambulatory Visit: Payer: Self-pay | Admitting: *Deleted

## 2019-07-03 DIAGNOSIS — Z85038 Personal history of other malignant neoplasm of large intestine: Secondary | ICD-10-CM | POA: Diagnosis not present

## 2019-07-03 DIAGNOSIS — C7A011 Malignant carcinoid tumor of the jejunum: Secondary | ICD-10-CM

## 2019-07-04 LAB — CHROMOGRANIN A REBASELINE
Chromogranin A (ng/mL): 102.8 ng/mL — ABNORMAL HIGH (ref 0.0–101.8)
Chromogranin A: 2 nmol/L (ref 0–5)

## 2019-07-06 ENCOUNTER — Telehealth: Payer: Self-pay | Admitting: Oncology

## 2019-07-06 ENCOUNTER — Inpatient Hospital Stay: Payer: Medicare HMO | Admitting: Oncology

## 2019-07-06 LAB — 5 HIAA, QUANTITATIVE, URINE, 24 HOUR
5-HIAA, Ur: 2.3 mg/L
5-HIAA,Quant.,24 Hr Urine: 6 mg/24 hr (ref 0.0–14.9)
Total Volume: 2600

## 2019-07-06 NOTE — Telephone Encounter (Signed)
Scheduled appt per 8/13 sch message - pt aware of new appt date and time

## 2019-07-07 ENCOUNTER — Telehealth: Payer: Self-pay

## 2019-07-07 NOTE — Telephone Encounter (Signed)
Attempted TC to pt. Unable to leave message. Will try again later

## 2019-09-03 ENCOUNTER — Other Ambulatory Visit: Payer: Self-pay | Admitting: Cardiology

## 2019-09-03 DIAGNOSIS — I10 Essential (primary) hypertension: Secondary | ICD-10-CM

## 2019-10-05 ENCOUNTER — Inpatient Hospital Stay: Payer: Medicare HMO | Attending: Oncology | Admitting: Oncology

## 2019-10-05 ENCOUNTER — Other Ambulatory Visit: Payer: Self-pay

## 2019-10-05 VITALS — BP 161/71 | HR 63 | Temp 98.3°F | Resp 17 | Ht 66.0 in | Wt 179.7 lb

## 2019-10-05 DIAGNOSIS — C7A011 Malignant carcinoid tumor of the jejunum: Secondary | ICD-10-CM | POA: Diagnosis not present

## 2019-10-05 DIAGNOSIS — I2510 Atherosclerotic heart disease of native coronary artery without angina pectoris: Secondary | ICD-10-CM | POA: Insufficient documentation

## 2019-10-05 DIAGNOSIS — K5900 Constipation, unspecified: Secondary | ICD-10-CM | POA: Diagnosis not present

## 2019-10-05 DIAGNOSIS — Z85060 Personal history of malignant carcinoid tumor of small intestine: Secondary | ICD-10-CM | POA: Diagnosis not present

## 2019-10-05 DIAGNOSIS — R32 Unspecified urinary incontinence: Secondary | ICD-10-CM | POA: Insufficient documentation

## 2019-10-05 NOTE — Progress Notes (Signed)
  Matthew Brennan OFFICE PROGRESS NOTE   Diagnosis: Carcinoid tumor  INTERVAL HISTORY:   Matthew Brennan returns as scheduled.  He feels well.  Good appetite and energy level.  No diarrhea or flushing.  He reports urinary "leakage "for the past several years.  He plans to discuss this with Dr. Dorthy Brennan at an appointment in a few weeks.  He has a bowel movement daily.  Constipation is relieved with MiraLAX.  Objective:  Vital signs in last 24 hours:  Blood pressure (!) 161/71, pulse 63, temperature 98.3 F (36.8 C), temperature source Temporal, resp. rate 17, height 5\' 6"  (1.676 m), weight 179 lb 11.2 oz (81.5 kg), SpO2 98 %.   Limited physical examination secondary to distancing with the Covid pandemic Lymphatics: No cervical, supraclavicular, axillary, or inguinal nodes GI: No hepatosplenomegaly, no mass, nontender Vascular: Trace pitting edema at the lower leg and ankle bilaterally     Lab Results:  Chromogranin A on 06/29/2019: 102.8-new methodology,2 by old assay  Medications: I have reviewed the patient's current medications.   Assessment/Plan: 1. 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 History of a partial small bowel obstruction secondary to the small bowel carcinoid tumor  3. Elevated preoperative chromogranin A and 24-hour urine 5 HIAA levels  4. Small lung nodules seen on the CT of the abdomen 12/23/2011-likely benign  5. Coronary artery disease, status post placement of a coronary stent August 2014     Disposition: Matthew Brennan mains in clinical remission from the carcinoid tumor.  The chromogranin A level was normal in August by the old methodology and just above the normal range with the new assay.  He would like  to continue follow-up at the Cancer center.  He will return for an office and lab visit in August 2021.  He will follow-up with Dr. Dorthy Brennan for evaluation of the urinary incontinence.  Matthew Coder, MD  10/05/2019  3:10 PM

## 2019-10-06 ENCOUNTER — Telehealth: Payer: Self-pay | Admitting: Oncology

## 2019-10-06 NOTE — Telephone Encounter (Signed)
Scheduled per los. Called, no answer, not able to leave msg. Mailed printout  

## 2019-11-02 ENCOUNTER — Other Ambulatory Visit: Payer: Self-pay | Admitting: Cardiology

## 2019-11-02 DIAGNOSIS — M4316 Spondylolisthesis, lumbar region: Secondary | ICD-10-CM | POA: Diagnosis not present

## 2019-11-02 DIAGNOSIS — M5416 Radiculopathy, lumbar region: Secondary | ICD-10-CM | POA: Diagnosis not present

## 2019-11-02 DIAGNOSIS — Z6827 Body mass index (BMI) 27.0-27.9, adult: Secondary | ICD-10-CM | POA: Diagnosis not present

## 2019-11-02 DIAGNOSIS — M47816 Spondylosis without myelopathy or radiculopathy, lumbar region: Secondary | ICD-10-CM | POA: Diagnosis not present

## 2019-11-02 DIAGNOSIS — I6523 Occlusion and stenosis of bilateral carotid arteries: Secondary | ICD-10-CM

## 2019-11-06 ENCOUNTER — Ambulatory Visit (INDEPENDENT_AMBULATORY_CARE_PROVIDER_SITE_OTHER): Payer: Medicare HMO

## 2019-11-06 ENCOUNTER — Other Ambulatory Visit: Payer: Self-pay

## 2019-11-06 ENCOUNTER — Other Ambulatory Visit: Payer: Self-pay | Admitting: Cardiology

## 2019-11-06 DIAGNOSIS — I350 Nonrheumatic aortic (valve) stenosis: Secondary | ICD-10-CM

## 2019-11-06 DIAGNOSIS — I6523 Occlusion and stenosis of bilateral carotid arteries: Secondary | ICD-10-CM

## 2019-11-13 ENCOUNTER — Ambulatory Visit: Payer: Medicare HMO | Admitting: Cardiology

## 2019-11-13 ENCOUNTER — Encounter: Payer: Self-pay | Admitting: Cardiology

## 2019-11-13 ENCOUNTER — Other Ambulatory Visit: Payer: Self-pay

## 2019-11-13 VITALS — BP 128/62 | HR 61 | Temp 98.4°F | Ht 66.0 in | Wt 173.0 lb

## 2019-11-13 DIAGNOSIS — I6523 Occlusion and stenosis of bilateral carotid arteries: Secondary | ICD-10-CM | POA: Diagnosis not present

## 2019-11-13 DIAGNOSIS — R001 Bradycardia, unspecified: Secondary | ICD-10-CM

## 2019-11-13 DIAGNOSIS — I2510 Atherosclerotic heart disease of native coronary artery without angina pectoris: Secondary | ICD-10-CM | POA: Diagnosis not present

## 2019-11-13 DIAGNOSIS — I10 Essential (primary) hypertension: Secondary | ICD-10-CM | POA: Diagnosis not present

## 2019-11-13 NOTE — Progress Notes (Signed)
Primary Physician/Referring:  Lujean Amel, MD  Patient ID: Matthew Brennan, male    DOB: 16-Jun-1932, 85 y.o.   MRN: [id number]  Chief Complaint  Patient presents with  . Coronary Artery Disease  . Follow-up    HPI: Matthew Brennan  is a 85 y.o. male  with  history of carcinoid syndrome, he has h/o partial small bowel resection in 2013, mild aortic stenosis, asymptomatic carotid stenosis, CAD s/p PCI to Cx with BMS in 2014, and chronic asymptomatic S. Bradycardia, right leg sciatica presents for 6 month f/u.   With regard to coronary artery disease, he has remained asymptomatic without recurrence of angina pectoris since angioplasty in 2014.  He continues to have marked underlying sinus bradycardia, asymptomatic. He also has asymptomatic bilateral carotid artery stenosis, right carotid artery stenosis is greater than 70% stenosed and is being closely monitored since 2018.  He has not had any visual disturbances, TIA-like symptoms. His main issue continues to be severe back pain and right leg sciatica.  Past Medical History:  Diagnosis Date  . Abdominal distension   . Abdominal pain   . Blood transfusion 1992   s/p GI bleed  . CAD (coronary artery disease)   . 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 Carcinoid tumor of intestine   . Diverticulitis   . Diverticulosis of colon without diverticulitis   . Dysrhythmia    palpitations from MVP occ  . ED (erectile dysfunction)   . Enlarged prostate with lower urinary tract symptoms (LUTS)   . Heart murmur   . Hx SBO   . Hypertension   . Hypothyroidism   . Mitral valve prolapse    last visit w/ cardiologist 1999 in [place]  . Obesity   . Pneumonia    as a child  . Recurrent upper respiratory infection (URI)   . Testosterone deficiency   . Thyroid disease    hypothyroidism    Past Surgical History:  Procedure Laterality Date  . BOWEL RESECTION  02/17/2012   Procedure: SMALL BOWEL RESECTION;  Surgeon: Adin Hector, MD;  Location: [facility];  Service: General;;  small bowel resection and wedge resection small  bowel mass.  . exploritory laperotmy with small bowel resection  02/17/2012  . EYE SURGERY  2000,2003   cataract ext/iol bilateral  . LAPAROTOMY  02/17/2012   Procedure: EXPLORATORY LAPAROTOMY;  Surgeon: Adin Hector, MD;  Location: [place];  Service: General;  Laterality: N/A;  . LEFT HEART CATHETERIZATION WITH CORONARY ANGIOGRAM N/A 07/06/2013   Procedure: LEFT HEART CATHETERIZATION WITH CORONARY ANGIOGRAM;  Surgeon: Laverda Page, MD;  Location: [facility] CATH LAB;  Service: Cardiovascular;  Laterality: N/A;  . MOUTH SURGERY  [phone] 2004  . NO PAST SURGERIES    . no prior surgeries      Social History   Socioeconomic History  . Marital status: Married    Spouse name: Not on file  . Number of children: Not on file  . Years of education: Not on file  . Highest education level: Not on file  Occupational History  . Not on file  Tobacco Use  . Smoking status: Former Smoker    Packs/day: 4.00    Years: 5.00    Pack years: 20.00    Types: Cigarettes    Quit date: 01/06/1962    Years since quitting: 57.8  . Smokeless tobacco: Never Used  Substance and Sexual Activity  . Alcohol use: No  . Drug use:  No  . Sexual activity: Yes  Other Topics Concern  . Not on file  Social History Narrative  . Not on file   Social Determinants of Health   Financial Resource Strain:   . Difficulty of Paying Living Expenses: Not on file  Food Insecurity:   . Worried About Charity fundraiser in the Last Year: Not on file  . Ran Out of Food in the Last Year: Not on file  Transportation Needs:   . Lack of Transportation (Medical): Not on file  . Lack of Transportation (Non-Medical): Not on file  Physical Activity:   . Days of Exercise per Week: Not on file  . Minutes of Exercise per Session: Not on file  Stress:   . Feeling of Stress : Not on file  Social Connections:   . Frequency of  Communication with Friends and Family: Not on file  . Frequency of Social Gatherings with Friends and Family: Not on file  . Attends Religious Services: Not on file  . Active Member of Clubs or Organizations: Not on file  . Attends Archivist Meetings: Not on file  . Marital Status: Not on file  Intimate Partner Violence:   . Fear of Current or Ex-Partner: Not on file  . Emotionally Abused: Not on file  . Physically Abused: Not on file  . Sexually Abused: Not on file   Review of Systems  Constitution: Negative for chills, decreased appetite, malaise/fatigue and weight gain.  HENT: Nosebleeds: chronic and mild.   Cardiovascular: Positive for leg swelling. Negative for dyspnea on exertion and syncope.  Endocrine: Negative for cold intolerance.  Hematologic/Lymphatic: Does not bruise/bleed easily.  Musculoskeletal: Positive for back pain (chronic) and muscle cramps (righ leg sciatica). Negative for joint swelling.  Gastrointestinal: Negative for abdominal pain, anorexia, change in bowel habit, constipation, hematochezia and melena.  Neurological: Negative for headaches and light-headedness.  Psychiatric/Behavioral: Negative for depression and substance abuse.  All other systems reviewed and are negative.     Objective  Blood pressure 128/62, pulse 61, temperature 98.4 F (36.9 C), height 5\' 6"  (1.676 m), weight 173 lb (78.5 kg), SpO2 98 %. Body mass index is 27.92 kg/m.    Physical Exam  Constitutional: He appears well-developed and well-nourished. No distress.  HENT:  Head: Atraumatic.  Eyes: Conjunctivae are normal.  Neck: No JVD present. No thyromegaly present.  Cardiovascular: Normal rate, regular rhythm, S1 normal, S2 normal and intact distal pulses. Exam reveals no gallop.  Murmur heard.  Harsh midsystolic murmur is present with a grade of 2/6 at the upper right sternal border. Pulses:      Carotid pulses are on the right side with bruit and on the left side with  bruit. Trace leg edema  Pulmonary/Chest: Effort normal and breath sounds normal.  Abdominal: Soft. Bowel sounds are normal.  Musculoskeletal:        General: No edema. Normal range of motion.     Cervical back: Neck supple.  Neurological: He is alert.  Skin: Skin is warm and dry.  Psychiatric: He has a normal mood and affect.   Radiology: No results found.  Laboratory examination:    CMP Latest Ref Rng & Units 01/02/2019 07/07/2013 07/05/2013  Glucose 65 - 99 mg/dL 91 99 101(H)  BUN 8 - 27 mg/dL 42(H) 22 31(H)  Creatinine 0.76 - 1.27 mg/dL 1.33(H) 1.09 1.50(H)  Sodium 134 - 144 mmol/L 141 141 138  Potassium 3.5 - 5.2 mmol/L 4.5 3.8 4.1  Chloride  96 - 106 mmol/L 108(H) 108 102  CO2 20 - 29 mmol/L 20 22 25   Calcium 8.6 - 10.2 mg/dL 9.1 8.8 9.2  Total Protein 6.0 - 8.3 g/dL - - -  Total Bilirubin 0.3 - 1.2 mg/dL - - -  Alkaline Phos 39 - 117 U/L - - -  AST 0 - 37 U/L - - -  ALT 0 - 53 U/L - - -   CBC Latest Ref Rng & Units 07/07/2013 07/06/2013 07/05/2013  WBC 4.0 - 10.5 K/uL 6.3 5.9 7.9  Hemoglobin 13.0 - 17.0 g/dL 12.2(L) 12.4(L) 14.0  Hematocrit 39.0 - 52.0 % 35.4(L) 36.0(L) 38.7(L)  Platelets 150 - 400 K/uL 153 162 204   Lipid Panel  No results found for: CHOL, TRIG, HDL, CHOLHDL, VLDL, LDLCALC, LDLDIRECT HEMOGLOBIN A1C No results found for: HGBA1C, MPG TSH No results for input(s): TSH in the last 8760 hours.   Medications   There are no discontinued medications. Current Meds  Medication Sig  . ARMOUR THYROID 15 MG tablet Take 45 mg by mouth daily. Takes total of 45mg  daily  . Ascorbic Acid (VITAMIN C) 1000 MG tablet Take 3,000 mg by mouth daily.   Marland Kitchen aspirin 81 MG tablet Take 1 tablet (81 mg total) by mouth daily. (Patient taking differently: Take 81 mg by mouth daily. 2 tabs at night, 1 in AM)  . atorvastatin (LIPITOR) 10 MG tablet Take 1 tablet (10 mg total) by mouth daily at 6 PM.  . Black Pepper-Turmeric (TURMERIC CURCUMIN) 03-999 MG CAPS Take by mouth.  .  Cholecalciferol (VITAMIN D3) 125 MCG (5000 UT) CAPS Take 1 capsule by mouth daily.  . diphenhydramine-acetaminophen (TYLENOL PM) 25-500 MG TABS Take 1 tablet by mouth at bedtime.  . Melatonin 10 MG TABS Take 20 mg by mouth daily.   . Multiple Vitamins-Minerals (MULTIVITAL PO) Take 1 capsule by mouth daily. One-A-Day  . valsartan-hydrochlorothiazide (DIOVAN-HCT) 320-12.5 MG tablet TAKE 1 TABLET BY MOUTH EVERY DAY  . vitamin E 1000 UNIT capsule Take 1,000 Units by mouth daily.  . Zinc 100 MG TABS Take 1 tablet by mouth daily.   Cardiac Studies:   Coronary angiogram 07/06/2013: Large circumflex, 99% stenosis. Successful stenting a 4.0 x 16 mm Rebel non-drug-eluting stent. Mild disease in other vessels.  Echocardiogram 11/06/2019: Left ventricle cavity is normal in size. Mild concentric hypertrophy of the left ventricle. Normal global wall motion. Normal LV systolic function with EF 56%. Doppler evidence of grade I (impaired) diastolic dysfunction, normal LAP.  Trileaflet aortic valve with moderate calcification. Mild aortic stenosis. Aortic valve mean gradient of 12 mmHg, Vmax of 2.2 m/s. Calculated aortic valve area by continuity equation is 2.0 cm. Moderate (grade II) aortic regurgitation. Mild (Grade I) mitral regurgitation. Mild tricuspid regurgitation.  No evidence of pulmonary hypertension. No significant change compared to previous study on 06/16/2017.   Carotid artery duplex  11/06/19  Stenosis in the right internal carotid artery (>=70%). The right PSV internal/common carotid artery ratio of 9.5 is consistent with a stenosis of >70%. peak capacity 371/87 cm/s. Stenosis in the right external carotid artery (>50%). Stenosis in the left internal carotid artery (50-69%). Antegrade right vertebral artery flow. Antegrade left vertebral artery flow. Compared to 05/03/2019, there is slight improvement in severity of right ICA stenosis. Follow up in six months is appropriate if clinically  indicated.  Assessment     ICD-10-CM   1. Asymptomatic bilateral carotid artery stenosis  I65.23   2. Coronary artery disease involving native coronary  artery of native heart without angina pectoris  I25.10 EKG 12-Lead  3. Essential hypertension  I10   4. Bradycardia on ECG  R00.1     EKG 11/13/2019: Sinus rhythm with borderline first-degree AV block at the rate of 61 bpm, normal axis, no evidence of ischemia. No significant change from  EKG 11/24/2018: Marked sinus bradycardia at the rate of 43 bpm, No significant change from EKG 09/05/2018  Recommendations:   Matthew Brennan  is a 85 y.o. male  with  history of carcinoid syndrome, he has h/o partial small bowel resection in 2013, mild aortic stenosis, asymptomatic carotid stenosis, CAD s/p PCI to Cx with BMS in 2014, and chronic asymptomatic S. Bradycardia, right leg sciatica presents for 6 month f/u.   He is scheduled for steroid shot to his back tomorrow. Not felt to be a surgical candidate due to need for very prolonged recovery time necessary.   He is presently doing well without recurrence of angina pectoris, with regard to bradycardia again remains asymptomatic without fatigue, dizziness.  Carotid artery stenosis severity has remained stable with greater than 70 probably 80% stenosis in the right ICA but no change since 2018.  As he remains asymptomatic, we could certainly continue watching closely with serial surveillance duplex.  Although 85 years of age, continues to remain fairly active.  His lipids are being managed by his PCP, goal LDL is <70 given CAD and carotid disease.  I reviewed his echocardiogram, aortic stenosis is remained stable and is very mild.  I will see him back on an annual basis.    Adrian Prows, MD, [facility] 11/13/2019, 1:31 PM [facility] [place] Pager: [phone] Office: [phone]

## 2019-11-14 ENCOUNTER — Other Ambulatory Visit: Payer: Self-pay

## 2019-11-14 DIAGNOSIS — M5416 Radiculopathy, lumbar region: Secondary | ICD-10-CM | POA: Diagnosis not present

## 2019-11-14 DIAGNOSIS — E7800 Pure hypercholesterolemia, unspecified: Secondary | ICD-10-CM

## 2019-11-14 MED ORDER — ATORVASTATIN CALCIUM 10 MG PO TABS: 10.0000 mg | ORAL_TABLET | Freq: Every day | ORAL | 1 refills | Status: DC

## 2019-11-29 DIAGNOSIS — R42 Dizziness and giddiness: Secondary | ICD-10-CM | POA: Diagnosis not present

## 2019-11-29 DIAGNOSIS — J069 Acute upper respiratory infection, unspecified: Secondary | ICD-10-CM | POA: Diagnosis not present

## 2019-11-30 DIAGNOSIS — R05 Cough: Secondary | ICD-10-CM | POA: Diagnosis not present

## 2019-12-01 ENCOUNTER — Emergency Department (HOSPITAL_COMMUNITY)
Admission: EM | Admit: 2019-12-01 | Discharge: 2019-12-02 | Disposition: A | Discharge status: Home / Self Care | Payer: Medicare HMO | Attending: Emergency Medicine | Admitting: Emergency Medicine

## 2019-12-01 ENCOUNTER — Other Ambulatory Visit: Payer: Self-pay

## 2019-12-01 ENCOUNTER — Encounter (HOSPITAL_COMMUNITY): Payer: Self-pay

## 2019-12-01 DIAGNOSIS — Z7982 Long term (current) use of aspirin: Secondary | ICD-10-CM | POA: Diagnosis not present

## 2019-12-01 DIAGNOSIS — E860 Dehydration: Secondary | ICD-10-CM

## 2019-12-01 DIAGNOSIS — E039 Hypothyroidism, unspecified: Secondary | ICD-10-CM | POA: Diagnosis not present

## 2019-12-01 DIAGNOSIS — R42 Dizziness and giddiness: Secondary | ICD-10-CM

## 2019-12-01 DIAGNOSIS — I10 Essential (primary) hypertension: Secondary | ICD-10-CM | POA: Insufficient documentation

## 2019-12-01 DIAGNOSIS — R05 Cough: Secondary | ICD-10-CM | POA: Diagnosis present

## 2019-12-01 DIAGNOSIS — Z79899 Other long term (current) drug therapy: Secondary | ICD-10-CM | POA: Insufficient documentation

## 2019-12-01 DIAGNOSIS — I2510 Atherosclerotic heart disease of native coronary artery without angina pectoris: Secondary | ICD-10-CM | POA: Diagnosis not present

## 2019-12-01 DIAGNOSIS — R630 Anorexia: Secondary | ICD-10-CM | POA: Diagnosis not present

## 2019-12-01 DIAGNOSIS — Z87891 Personal history of nicotine dependence: Secondary | ICD-10-CM | POA: Insufficient documentation

## 2019-12-01 LAB — CBC
HCT: 37.5 % — ABNORMAL LOW (ref 39.0–52.0)
Hemoglobin: 12.4 g/dL — ABNORMAL LOW (ref 13.0–17.0)
MCH: 31.9 pg (ref 26.0–34.0)
MCHC: 33.1 g/dL (ref 30.0–36.0)
MCV: 96.4 fL (ref 80.0–100.0)
Platelets: 176 10*3/uL (ref 150–400)
RBC: 3.89 MIL/uL — ABNORMAL LOW (ref 4.22–5.81)
RDW: 12.8 % (ref 11.5–15.5)
WBC: 6.5 10*3/uL (ref 4.0–10.5)
nRBC: 0 % (ref 0.0–0.2)

## 2019-12-01 LAB — BASIC METABOLIC PANEL
Anion gap: 10 (ref 5–15)
BUN: 55 mg/dL — ABNORMAL HIGH (ref 8–23)
CO2: 15 mmol/L — ABNORMAL LOW (ref 22–32)
Calcium: 8.5 mg/dL — ABNORMAL LOW (ref 8.9–10.3)
Chloride: 110 mmol/L (ref 98–111)
Creatinine, Ser: 1.37 mg/dL — ABNORMAL HIGH (ref 0.61–1.24)
GFR calc Af Amer: 54 mL/min — ABNORMAL LOW (ref >60)
GFR calc non Af Amer: 47 mL/min — ABNORMAL LOW (ref >60)
Glucose, Bld: 114 mg/dL — ABNORMAL HIGH (ref 70–99)
Potassium: 3.7 mmol/L (ref 3.5–5.1)
Sodium: 135 mmol/L (ref 135–145)

## 2019-12-01 NOTE — ED Triage Notes (Signed)
Pt reports dizziness, loss of appetite and dry cough for the past 3 weeks, tested negative for COVID yesterday. Pt a.o, nad noted at this time.

## 2019-12-02 ENCOUNTER — Emergency Department (HOSPITAL_COMMUNITY): Payer: Medicare HMO

## 2019-12-02 DIAGNOSIS — R05 Cough: Secondary | ICD-10-CM | POA: Diagnosis not present

## 2019-12-02 MED ORDER — SODIUM CHLORIDE 0.9 % IV BOLUS
1000.0000 mL | Freq: Once | INTRAVENOUS | Status: AC
  Administered 2019-12-02: 1000 mL via INTRAVENOUS

## 2019-12-02 NOTE — ED Provider Notes (Signed)
[facility] EMERGENCY DEPARTMENT Provider Note   CSN: [id number] Arrival date & time: 12/01/19  1734     History Chief Complaint  Patient presents with  . Cough  . Anorexia  . Dizziness    Matthew Brennan is a 86 y.o. male with a hx of hypertension, mitral valve prolapse, coronary artery disease presents to the Emergency Department complaining of gradual, persistent, progressively worsening generalized fatigue and anorexia onset approximately 2 weeks ago.  Patient has associated dry cough.  He reports a chronic loss of taste and smell.  He denies fevers or chills, neck pain, neck stiffness, chest pain, shortness of breath, abdominal pain, nausea, vomiting, diarrhea, weakness, dizziness, syncope.  Patient does endorse some dizziness with standing.  He reports it lasts 15 to 30 seconds and resolves.  He reports he is sleeping 16+ hours per day which is unlike him.  Patient reports he was tested for Covid yesterday with a negative result.  He reports he is otherwise well.  No specific aggravating or alleviating factors.  He does report some weight loss due to his decreased p.o. intake.   The history is provided by the patient and medical records. No language interpreter was used.       Past Medical History:  Diagnosis Date  . Abdominal distension   . Abdominal pain   . Blood transfusion 1992   s/p GI bleed  . CAD (coronary artery disease)   . 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 Carcinoid tumor of intestine   . Diverticulitis   . Diverticulosis of colon without diverticulitis   . Dysrhythmia    palpitations from MVP occ  . ED (erectile dysfunction)   . Enlarged prostate with lower urinary tract symptoms (LUTS)   . Heart murmur   . Hx SBO   . Hypertension   . Hypothyroidism   . Mitral valve prolapse    last visit w/ cardiologist 1999 in [place]  . Obesity   . Pneumonia    as a child  . Recurrent upper respiratory infection (URI)   .  Testosterone deficiency   . Thyroid disease    hypothyroidism    Patient Active Problem List   Diagnosis Date Noted  . 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 Hypertension 01/07/2012  . Mitral valve prolapse 01/07/2012  . CAD (coronary artery disease) 01/07/2012    Past Surgical History:  Procedure Laterality Date  . BOWEL RESECTION  02/17/2012   Procedure: SMALL BOWEL RESECTION;  Surgeon: Adin Hector, MD;  Location: [facility];  Service: General;;  small bowel resection and wedge resection small  bowel mass.  . exploritory laperotmy with small bowel resection  02/17/2012  . EYE SURGERY  2000,2003   cataract ext/iol bilateral  . LAPAROTOMY  02/17/2012   Procedure: EXPLORATORY LAPAROTOMY;  Surgeon: Adin Hector, MD;  Location: [facility];  Service: General;  Laterality: N/A;  . LEFT HEART CATHETERIZATION WITH CORONARY ANGIOGRAM N/A 07/06/2013   Procedure: LEFT HEART CATHETERIZATION WITH CORONARY ANGIOGRAM;  Surgeon: Laverda Page, MD;  Location: [facility] CATH LAB;  Service: Cardiovascular;  Laterality: N/A;  . MOUTH SURGERY  [phone] 2004  . NO PAST SURGERIES    . no prior surgeries         Family History  Problem Relation Age of Onset  . Kidney disease Father   . Colon cancer Neg Hx   . Stomach cancer Neg Hx   . Anesthesia problems Neg  Hx     Social History   Tobacco Use  . Smoking status: Former Smoker    Packs/day: 4.00    Years: 5.00    Pack years: 20.00    Types: Cigarettes    Quit date: 01/06/1962    Years since quitting: 57.9  . Smokeless tobacco: Never Used  Substance Use Topics  . Alcohol use: No  . Drug use: No    Home Medications Prior to Admission medications   Medication Sig Start Date End Date Taking? Authorizing Provider  ARMOUR THYROID 15 MG tablet Take 45 mg by mouth daily.  06/14/14  Yes [provider]  Ascorbic Acid (VITAMIN C) 1000 MG tablet Take 3,000 mg by mouth daily.    Yes [provider]  aspirin 81 MG  tablet Take 1 tablet (81 mg total) by mouth daily. Patient taking differently: Take 81-162 mg by mouth See admin instructions. Take 1 tablet every morning and take 2 tablets at night 07/07/13  Yes Adrian Prows, MD  atorvastatin (LIPITOR) 10 MG tablet Take 1 tablet (10 mg total) by mouth daily at 6 PM. 11/14/19  Yes Adrian Prows, MD  Cholecalciferol (VITAMIN D3) 125 MCG (5000 UT) CAPS Take 5,000 Units by mouth daily.    Yes [provider]  diphenhydramine-acetaminophen (TYLENOL PM) 25-500 MG TABS Take 1 tablet by mouth at bedtime.   Yes [provider]  Melatonin 10 MG TABS Take 20 mg by mouth daily.    Yes [provider]  Multiple Vitamins-Minerals (MULTIVITAL PO) Take 1 capsule by mouth daily. One-A-Day   Yes [provider]  valsartan-hydrochlorothiazide (DIOVAN-HCT) 320-12.5 MG tablet TAKE 1 TABLET BY MOUTH EVERY DAY Patient taking differently: Take 1 tablet by mouth daily.  09/04/19  Yes Adrian Prows, MD  vitamin E 1000 UNIT capsule Take 1,000 Units by mouth daily.   Yes [provider]  Zinc 100 MG TABS Take 1 tablet by mouth daily.   Yes [provider]    Allergies    Aspirin, Ciprofloxacin, and Levothyroxine sodium  Review of Systems   Review of Systems  Constitutional: Positive for fatigue. Negative for appetite change, diaphoresis, fever and unexpected weight change.  HENT: Negative for mouth sores.   Eyes: Negative for visual disturbance.  Respiratory: Positive for cough. Negative for chest tightness, shortness of breath and wheezing.   Cardiovascular: Negative for chest pain.  Gastrointestinal: Negative for abdominal pain, constipation, diarrhea, nausea and vomiting.  Endocrine: Negative for polydipsia, polyphagia and polyuria.  Genitourinary: Negative for dysuria, frequency, hematuria and urgency.  Musculoskeletal: Negative for back pain and neck stiffness.  Skin: Negative for rash.  Allergic/Immunologic: Negative for  immunocompromised state.  Neurological: Positive for dizziness. Negative for syncope, light-headedness and headaches.  Hematological: Does not bruise/bleed easily.  Psychiatric/Behavioral: Negative for sleep disturbance. The patient is not nervous/anxious.     Physical Exam Updated Vital Signs BP (!) 151/64   Pulse 66   Temp 97.7 F (36.5 C) (Oral)   Resp 18   SpO2 98%   Physical Exam Vitals and nursing note reviewed.  Constitutional:      General: He is not in acute distress.    Appearance: He is not diaphoretic.  HENT:     Head: Normocephalic.  Eyes:     General: No scleral icterus.    Conjunctiva/sclera: Conjunctivae normal.  Cardiovascular:     Rate and Rhythm: Normal rate and regular rhythm.     Pulses: Normal pulses.  Radial pulses are 2+ on the right side and 2+ on the left side.  Pulmonary:     Effort: No tachypnea, accessory muscle usage, prolonged expiration, respiratory distress or retractions.     Breath sounds: Normal breath sounds. No stridor. No wheezing, rhonchi or rales.     Comments: Equal chest rise. No increased work of breathing. Abdominal:     General: There is no distension.     Palpations: Abdomen is soft.     Tenderness: There is no abdominal tenderness. There is no guarding or rebound.  Musculoskeletal:     Cervical back: Normal range of motion.     Comments: Moves all extremities equally and without difficulty.  Skin:    General: Skin is warm and dry.     Capillary Refill: Capillary refill takes less than 2 seconds.  Neurological:     Mental Status: He is alert.     GCS: GCS eye subscore is 4. GCS verbal subscore is 5. GCS motor subscore is 6.     Comments: Speech is clear and goal oriented.  Psychiatric:        Mood and Affect: Mood normal.     ED Results / Procedures / Treatments   Labs (all labs ordered are listed, but only abnormal results are displayed) Labs Reviewed  BASIC METABOLIC PANEL - Abnormal; Notable for the  following components:      Result Value   CO2 15 (*)    Glucose, Bld 114 (*)    BUN 55 (*)    Creatinine, Ser 1.37 (*)    Calcium 8.5 (*)    GFR calc non Af Amer 47 (*)    GFR calc Af Amer 54 (*)    All other components within normal limits  CBC - Abnormal; Notable for the following components:   RBC 3.89 (*)    Hemoglobin 12.4 (*)    HCT 37.5 (*)    All other components within normal limits    EKG EKG Interpretation  Date/Time:  Friday December 01 2019 18:13:37 EST Ventricular Rate:  71 PR Interval:  190 QRS Duration: 90 QT Interval:  368 QTC Calculation: 399 R Axis:   -3 Text Interpretation: Normal sinus rhythm Minimal voltage criteria for LVH, may be normal variant ( R in aVL ) Borderline ECG When compared with ECG of 07/07/2013, There has been a loss of anterolateral forces Confirmed by Delora Fuel [id number]) on 12/02/2019 12:13:26 AM   Radiology DG Chest Portable 1 View  Result Date: 12/02/2019 CLINICAL DATA:  Cough, COVID negative EXAM: PORTABLE CHEST 1 VIEW COMPARISON:  July 05, 2013 FINDINGS: The heart size and mediastinal contours are within normal limits. Both lungs are clear. The visualized skeletal structures are unremarkable. IMPRESSION: No active disease. Electronically Signed   By: Prudencio Pair M.D.   On: 12/02/2019 01:00    Procedures Procedures (including critical care time)  Medications Ordered in ED Medications  sodium chloride 0.9 % bolus 1,000 mL (1,000 mLs Intravenous New Bag/Given 12/02/19 0316)    ED Course  I have reviewed the triage vital signs and the nursing notes.  Pertinent labs & imaging results that were available during my care of the patient were reviewed by me and considered in my medical decision making (see chart for details).    MDM Rules/Calculators/A&P                      Patient presents with fatigue, dry cough, anorexia and dizziness.  Covid test negative yesterday.  Chest x-ray without evidence of pneumonia, pneumothorax or  pulmonary edema.  Personally evaluated these images.  Labs are reassuring.  EKG without evidence of ischemia.  Suspect some element of dehydration.  Will give fluids and reassess.  Orthostatic VS for the past 24 hrs:  BP- Lying Pulse- Lying BP- Sitting Pulse- Sitting BP- Standing at 0 minutes Pulse- Standing at 0 minutes  12/02/19 0214 117/69 59 117/57 67 101/58 70    4:43 AM Patient reports he is feeling significantly better after his fluids.  He remains without fever or chills, chest pain, abdominal pain, nausea or vomiting.  He remains in good spirits and wishes for discharge home.  I believe this is reasonable.  He will need close primary care follow-up for further evaluation of his symptoms.  I also discussed reasons to return immediately to the emergency department.  Patient states understanding and is in agreement with this plan.   Final Clinical Impression(s) / ED Diagnoses Final diagnoses:  Dizziness  Dehydration  Anorexia    Rx / DC Orders ED Discharge Orders    None       Damiana Berrian, Gwenlyn Perking [id number]    Delora Fuel, MD [id number] [phone]

## 2019-12-02 NOTE — Discharge Instructions (Addendum)
1. Medications: usual home medications 2. Treatment: rest, drink plenty of fluids, eat small meals 3. Follow Up: Please followup with your primary doctor in 2-3 days for further evaluation of your symptoms; Please return to the ER for recent dizziness, difficulty walking or speaking, vomiting, fevers or other concerns.

## 2019-12-10 ENCOUNTER — Other Ambulatory Visit: Payer: Self-pay | Admitting: Cardiology

## 2019-12-10 DIAGNOSIS — E7800 Pure hypercholesterolemia, unspecified: Secondary | ICD-10-CM

## 2020-01-01 ENCOUNTER — Other Ambulatory Visit: Payer: Self-pay | Admitting: Cardiology

## 2020-01-01 DIAGNOSIS — I10 Essential (primary) hypertension: Secondary | ICD-10-CM

## 2020-01-02 ENCOUNTER — Other Ambulatory Visit: Payer: Self-pay | Admitting: Cardiology

## 2020-01-02 DIAGNOSIS — E7800 Pure hypercholesterolemia, unspecified: Secondary | ICD-10-CM

## 2020-01-03 DIAGNOSIS — I10 Essential (primary) hypertension: Secondary | ICD-10-CM | POA: Diagnosis not present

## 2020-01-03 DIAGNOSIS — E7800 Pure hypercholesterolemia, unspecified: Secondary | ICD-10-CM | POA: Diagnosis not present

## 2020-01-03 DIAGNOSIS — Z86012 Personal history of benign carcinoid tumor: Secondary | ICD-10-CM | POA: Diagnosis not present

## 2020-01-03 DIAGNOSIS — I2510 Atherosclerotic heart disease of native coronary artery without angina pectoris: Secondary | ICD-10-CM | POA: Diagnosis not present

## 2020-01-03 DIAGNOSIS — E039 Hypothyroidism, unspecified: Secondary | ICD-10-CM | POA: Diagnosis not present

## 2020-01-03 NOTE — Telephone Encounter (Signed)
refill 

## 2020-01-13 ENCOUNTER — Ambulatory Visit: Payer: Medicare HMO | Attending: Internal Medicine

## 2020-01-13 DIAGNOSIS — Z23 Encounter for immunization: Secondary | ICD-10-CM

## 2020-01-13 NOTE — Progress Notes (Signed)
   Covid-19 Vaccination Clinic  Name:  RANGER MIDURA    MRN: [id number] DOB: 03/31/1932  01/13/2020  Mr. Springer was observed post Covid-19 immunization for 15 minutes without incidence. He was provided with Vaccine Information Sheet and instruction to access the V-Safe system.   Mr. Levandowski was instructed to call 911 with any severe reactions post vaccine: Marland Kitchen Difficulty breathing  . Swelling of your face and throat  . A fast heartbeat  . A bad rash all over your body  . Dizziness and weakness    Immunizations Administered    Name Date Dose VIS Date Route   Pfizer COVID-19 Vaccine 01/13/2020  9:13 AM 0.3 mL 11/03/2019 Intramuscular   Manufacturer: [place]   Lot: [id number]   [place]: [id number]

## 2020-01-16 DIAGNOSIS — I10 Essential (primary) hypertension: Secondary | ICD-10-CM | POA: Diagnosis not present

## 2020-01-16 DIAGNOSIS — E7800 Pure hypercholesterolemia, unspecified: Secondary | ICD-10-CM | POA: Diagnosis not present

## 2020-01-16 DIAGNOSIS — E039 Hypothyroidism, unspecified: Secondary | ICD-10-CM | POA: Diagnosis not present

## 2020-02-05 ENCOUNTER — Ambulatory Visit: Payer: Medicare HMO | Attending: Internal Medicine

## 2020-02-05 DIAGNOSIS — Z23 Encounter for immunization: Secondary | ICD-10-CM

## 2020-02-05 NOTE — Progress Notes (Signed)
   Covid-19 Vaccination Clinic  Name:  Matthew Brennan    MRN: [id number] DOB: October 21, 1932  02/05/2020  [name] was observed post Covid-19 immunization for 15 minutes without incident. He was provided with Vaccine Information Sheet and instruction to access the V-Safe system.   [name] was instructed to call 911 with any severe reactions post vaccine: Marland Kitchen Difficulty breathing  . Swelling of face and throat  . A fast heartbeat  . A bad rash all over body  . Dizziness and weakness   Immunizations Administered    Name Date Dose VIS Date Route   Pfizer COVID-19 Vaccine 02/05/2020 10:01 AM 0.3 mL 11/03/2019 Intramuscular   Manufacturer: [place]   Lot: [id number]   [place]: [id number]

## 2020-02-08 ENCOUNTER — Other Ambulatory Visit: Payer: Self-pay | Admitting: Cardiology

## 2020-02-08 DIAGNOSIS — E7800 Pure hypercholesterolemia, unspecified: Secondary | ICD-10-CM

## 2020-03-14 DIAGNOSIS — M47816 Spondylosis without myelopathy or radiculopathy, lumbar region: Secondary | ICD-10-CM | POA: Diagnosis not present

## 2020-03-14 DIAGNOSIS — M7918 Myalgia, other site: Secondary | ICD-10-CM | POA: Diagnosis not present

## 2020-03-14 DIAGNOSIS — M4316 Spondylolisthesis, lumbar region: Secondary | ICD-10-CM | POA: Diagnosis not present

## 2020-04-01 DIAGNOSIS — M47816 Spondylosis without myelopathy or radiculopathy, lumbar region: Secondary | ICD-10-CM | POA: Diagnosis not present

## 2020-04-30 DIAGNOSIS — M4316 Spondylolisthesis, lumbar region: Secondary | ICD-10-CM | POA: Diagnosis not present

## 2020-04-30 DIAGNOSIS — M47816 Spondylosis without myelopathy or radiculopathy, lumbar region: Secondary | ICD-10-CM | POA: Diagnosis not present

## 2020-05-02 ENCOUNTER — Other Ambulatory Visit: Payer: Medicare HMO

## 2020-05-02 DIAGNOSIS — E7800 Pure hypercholesterolemia, unspecified: Secondary | ICD-10-CM | POA: Diagnosis not present

## 2020-05-02 DIAGNOSIS — Z0001 Encounter for general adult medical examination with abnormal findings: Secondary | ICD-10-CM | POA: Diagnosis not present

## 2020-05-02 DIAGNOSIS — E039 Hypothyroidism, unspecified: Secondary | ICD-10-CM | POA: Diagnosis not present

## 2020-05-02 DIAGNOSIS — I10 Essential (primary) hypertension: Secondary | ICD-10-CM | POA: Diagnosis not present

## 2020-05-02 DIAGNOSIS — L570 Actinic keratosis: Secondary | ICD-10-CM | POA: Diagnosis not present

## 2020-05-02 DIAGNOSIS — K5901 Slow transit constipation: Secondary | ICD-10-CM | POA: Diagnosis not present

## 2020-05-02 DIAGNOSIS — I2510 Atherosclerotic heart disease of native coronary artery without angina pectoris: Secondary | ICD-10-CM | POA: Diagnosis not present

## 2020-05-02 DIAGNOSIS — Z79899 Other long term (current) drug therapy: Secondary | ICD-10-CM | POA: Diagnosis not present

## 2020-05-02 DIAGNOSIS — Z86012 Personal history of benign carcinoid tumor: Secondary | ICD-10-CM | POA: Diagnosis not present

## 2020-05-03 ENCOUNTER — Ambulatory Visit: Payer: Medicare HMO

## 2020-05-03 ENCOUNTER — Other Ambulatory Visit: Payer: Self-pay

## 2020-05-03 DIAGNOSIS — I6523 Occlusion and stenosis of bilateral carotid arteries: Secondary | ICD-10-CM | POA: Diagnosis not present

## 2020-05-06 ENCOUNTER — Other Ambulatory Visit: Payer: Self-pay | Admitting: Cardiology

## 2020-05-06 DIAGNOSIS — I6523 Occlusion and stenosis of bilateral carotid arteries: Secondary | ICD-10-CM

## 2020-05-06 NOTE — Progress Notes (Signed)
Labs 05/02/2020:  Hb 12.3/HCT 35.9, platelets 131.  Normal indicis.  Serum glucose 93 mg, BUN 28, creatinine 1.26, EGFR 54 mL.  CMP normal.  TSH normal at 1.30.  Total cholesterol 108,, triglycerides 104, HDL 39, LDL 49.

## 2020-05-07 NOTE — Progress Notes (Signed)
No change in carotid stenosis. Will recheck and see him after 6 months repeat testing.  Patient has been messaged, make sure Carotid artery duplex is done prior to his visit

## 2020-05-17 DIAGNOSIS — L570 Actinic keratosis: Secondary | ICD-10-CM | POA: Diagnosis not present

## 2020-05-17 DIAGNOSIS — X32XXXD Exposure to sunlight, subsequent encounter: Secondary | ICD-10-CM | POA: Diagnosis not present

## 2020-06-05 DIAGNOSIS — M47816 Spondylosis without myelopathy or radiculopathy, lumbar region: Secondary | ICD-10-CM | POA: Diagnosis not present

## 2020-06-05 DIAGNOSIS — M4316 Spondylolisthesis, lumbar region: Secondary | ICD-10-CM | POA: Diagnosis not present

## 2020-06-17 DIAGNOSIS — M5416 Radiculopathy, lumbar region: Secondary | ICD-10-CM | POA: Diagnosis not present

## 2020-06-18 ENCOUNTER — Telehealth: Payer: Self-pay | Admitting: Nurse Practitioner

## 2020-06-18 NOTE — Telephone Encounter (Signed)
Rescheduled appt per print out with hand written instructions. Left voicemail with cancellation details as well as new appt date and time.

## 2020-06-20 ENCOUNTER — Other Ambulatory Visit: Payer: Self-pay | Admitting: *Deleted

## 2020-06-21 ENCOUNTER — Inpatient Hospital Stay: Payer: Medicare HMO

## 2020-06-21 ENCOUNTER — Inpatient Hospital Stay: Payer: Medicare HMO | Admitting: Nurse Practitioner

## 2020-07-04 ENCOUNTER — Ambulatory Visit: Payer: Medicare HMO | Admitting: Oncology

## 2020-07-04 ENCOUNTER — Other Ambulatory Visit: Payer: Medicare HMO

## 2020-07-08 DIAGNOSIS — M5416 Radiculopathy, lumbar region: Secondary | ICD-10-CM | POA: Diagnosis not present

## 2020-07-08 DIAGNOSIS — M4316 Spondylolisthesis, lumbar region: Secondary | ICD-10-CM | POA: Diagnosis not present

## 2020-07-08 DIAGNOSIS — M47816 Spondylosis without myelopathy or radiculopathy, lumbar region: Secondary | ICD-10-CM | POA: Diagnosis not present

## 2020-07-18 ENCOUNTER — Other Ambulatory Visit: Payer: Self-pay | Admitting: Rehabilitation

## 2020-07-18 DIAGNOSIS — M419 Scoliosis, unspecified: Secondary | ICD-10-CM

## 2020-07-24 DIAGNOSIS — G20A1 Parkinson's disease without dyskinesia, without mention of fluctuations: Secondary | ICD-10-CM

## 2020-07-24 DIAGNOSIS — G20 Parkinson's disease: Secondary | ICD-10-CM

## 2020-07-24 HISTORY — DX: Parkinson's disease without dyskinesia, without mention of fluctuations: G20.A1

## 2020-07-24 HISTORY — DX: Parkinson's disease: G20

## 2020-08-01 DIAGNOSIS — Z23 Encounter for immunization: Secondary | ICD-10-CM | POA: Diagnosis not present

## 2020-08-02 ENCOUNTER — Other Ambulatory Visit: Payer: Self-pay | Admitting: Cardiology

## 2020-08-02 DIAGNOSIS — E7800 Pure hypercholesterolemia, unspecified: Secondary | ICD-10-CM

## 2020-08-16 ENCOUNTER — Ambulatory Visit
Admission: RE | Admit: 2020-08-16 | Discharge: 2020-08-16 | Disposition: A | Discharge status: Home / Self Care | Payer: Medicare HMO | Source: Ambulatory Visit | Attending: Rehabilitation | Admitting: Rehabilitation

## 2020-08-16 DIAGNOSIS — M48061 Spinal stenosis, lumbar region without neurogenic claudication: Secondary | ICD-10-CM | POA: Diagnosis not present

## 2020-08-16 DIAGNOSIS — M419 Scoliosis, unspecified: Secondary | ICD-10-CM

## 2020-09-18 DIAGNOSIS — G250 Essential tremor: Secondary | ICD-10-CM | POA: Diagnosis not present

## 2020-09-18 DIAGNOSIS — R413 Other amnesia: Secondary | ICD-10-CM | POA: Diagnosis not present

## 2020-09-18 DIAGNOSIS — D649 Anemia, unspecified: Secondary | ICD-10-CM | POA: Diagnosis not present

## 2020-10-23 ENCOUNTER — Other Ambulatory Visit: Payer: Self-pay

## 2020-10-23 ENCOUNTER — Ambulatory Visit: Payer: Medicare HMO

## 2020-10-23 DIAGNOSIS — I6523 Occlusion and stenosis of bilateral carotid arteries: Secondary | ICD-10-CM | POA: Diagnosis not present

## 2020-10-24 ENCOUNTER — Other Ambulatory Visit: Payer: Self-pay | Admitting: Cardiology

## 2020-10-24 DIAGNOSIS — I6523 Occlusion and stenosis of bilateral carotid arteries: Secondary | ICD-10-CM

## 2020-10-31 DIAGNOSIS — Z01818 Encounter for other preprocedural examination: Secondary | ICD-10-CM | POA: Diagnosis not present

## 2020-10-31 DIAGNOSIS — G894 Chronic pain syndrome: Secondary | ICD-10-CM | POA: Diagnosis not present

## 2020-10-31 DIAGNOSIS — M4316 Spondylolisthesis, lumbar region: Secondary | ICD-10-CM | POA: Diagnosis not present

## 2020-10-31 DIAGNOSIS — M7918 Myalgia, other site: Secondary | ICD-10-CM | POA: Diagnosis not present

## 2020-10-31 DIAGNOSIS — M5416 Radiculopathy, lumbar region: Secondary | ICD-10-CM | POA: Diagnosis not present

## 2020-10-31 DIAGNOSIS — M47816 Spondylosis without myelopathy or radiculopathy, lumbar region: Secondary | ICD-10-CM | POA: Diagnosis not present

## 2020-11-08 DIAGNOSIS — I10 Essential (primary) hypertension: Secondary | ICD-10-CM | POA: Diagnosis not present

## 2020-11-08 DIAGNOSIS — G252 Other specified forms of tremor: Secondary | ICD-10-CM | POA: Diagnosis not present

## 2020-11-08 DIAGNOSIS — E7800 Pure hypercholesterolemia, unspecified: Secondary | ICD-10-CM | POA: Diagnosis not present

## 2020-11-08 DIAGNOSIS — E039 Hypothyroidism, unspecified: Secondary | ICD-10-CM | POA: Diagnosis not present

## 2020-11-11 ENCOUNTER — Encounter: Payer: Self-pay | Admitting: Cardiology

## 2020-11-11 ENCOUNTER — Other Ambulatory Visit: Payer: Self-pay

## 2020-11-11 ENCOUNTER — Ambulatory Visit: Payer: Medicare HMO | Admitting: Cardiology

## 2020-11-11 VITALS — BP 139/59 | HR 59 | Resp 16 | Ht 66.0 in | Wt 165.0 lb

## 2020-11-11 DIAGNOSIS — E7800 Pure hypercholesterolemia, unspecified: Secondary | ICD-10-CM | POA: Diagnosis not present

## 2020-11-11 DIAGNOSIS — I6523 Occlusion and stenosis of bilateral carotid arteries: Secondary | ICD-10-CM

## 2020-11-11 DIAGNOSIS — G20 Parkinson's disease: Secondary | ICD-10-CM | POA: Diagnosis not present

## 2020-11-11 DIAGNOSIS — I10 Essential (primary) hypertension: Secondary | ICD-10-CM

## 2020-11-11 DIAGNOSIS — I2510 Atherosclerotic heart disease of native coronary artery without angina pectoris: Secondary | ICD-10-CM | POA: Diagnosis not present

## 2020-11-11 NOTE — Progress Notes (Signed)
Primary Physician/Referring:  Lujean Amel, MD  Patient ID: Matthew Brennan, male    DOB: [birth year], 86 y.o.   MRN: [id number]  Chief Complaint  Patient presents with  . carotid stenosis  . Hypertension  . Follow-up    1 year    HPI: Matthew Brennan  is a 86 y.o. male with  history of carcinoid syndrome, h/o partial small bowel resection in 2013, mild aortic stenosis, asymptomatic bilateral carotid stenosis, CAD s/p PCI to Cx with BMS in 2014, and chronic asymptomatic S. Bradycardia, right leg sciatica presents for 6 month f/u.   With regard to coronary artery disease, he has remained asymptomatic without recurrence of angina pectoris since angioplasty in 2014. He also has asymptomatic bilateral carotid artery stenosis, right carotid artery stenosis is greater than 70% stenosed and is being closely monitored since 2018.  He has not had any visual disturbances or TIA-like symptoms. His main issue continues to be severe back pain and right leg sciatica. He is planning to have spine stimulator in Jan 2022.  Past Medical History:  Diagnosis Date  . Abdominal distension   . Abdominal pain   . Blood transfusion 1992   s/p GI bleed  . CAD (coronary artery disease)   . 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 Carcinoid tumor of intestine   . Diverticulitis   . Diverticulosis of colon without diverticulitis   . Dysrhythmia    palpitations from MVP occ  . ED (erectile dysfunction)   . Enlarged prostate with lower urinary tract symptoms (LUTS)   . Heart murmur   . Hx SBO   . Hypertension   . Hypothyroidism   . Mitral valve prolapse    last visit w/ cardiologist 1999 in [place]  . Obesity   . Parkinson disease ([place]) 07/2020  . Pneumonia    as a child  . Recurrent upper respiratory infection (URI)   . Testosterone deficiency   . Thyroid disease    hypothyroidism    Past Surgical History:  Procedure Laterality Date  . BOWEL RESECTION  02/17/2012   Procedure:  SMALL BOWEL RESECTION;  Surgeon: Adin Hector, MD;  Location: [place];  Service: General;;  small bowel resection and wedge resection small  bowel mass.  . exploritory laperotmy with small bowel resection  02/17/2012  . EYE SURGERY  2000,2003   cataract ext/iol bilateral  . LAPAROTOMY  02/17/2012   Procedure: EXPLORATORY LAPAROTOMY;  Surgeon: Adin Hector, MD;  Location: [place];  Service: General;  Laterality: N/A;  . LEFT HEART CATHETERIZATION WITH CORONARY ANGIOGRAM N/A 07/06/2013   Procedure: LEFT HEART CATHETERIZATION WITH CORONARY ANGIOGRAM;  Surgeon: Laverda Page, MD;  Location: [facility] CATH LAB;  Service: Cardiovascular;  Laterality: N/A;  . MOUTH SURGERY  [phone] 2004  . NO PAST SURGERIES    . no prior surgeries     Social History   Tobacco Use  . Smoking status: Former Smoker    Packs/day: 4.00    Years: 5.00    Pack years: 20.00    Types: Cigarettes    Quit date: 01/06/1962    Years since quitting: 58.8  . Smokeless tobacco: Never Used  Substance Use Topics  . Alcohol use: No   Marital Status: Married    Review of Systems  Constitutional: Negative for chills, decreased appetite, malaise/fatigue and weight gain.  HENT: Nosebleeds: chronic and mild.   Cardiovascular: Positive for leg swelling (stable). Negative for dyspnea on exertion and  syncope.  Endocrine: Negative for cold intolerance.  Hematologic/Lymphatic: Does not bruise/bleed easily.  Musculoskeletal: Positive for back pain (chronic, progressive and severe) and muscle cramps (righ leg sciatica). Negative for joint swelling.  Gastrointestinal: Negative for abdominal pain, anorexia, change in bowel habit, constipation, hematochezia and melena.  Neurological: Negative for headaches and light-headedness.  Psychiatric/Behavioral: Negative for depression and substance abuse.  All other systems reviewed and are negative.  Objective  Blood pressure (!) 139/59, pulse (!) 59, resp. rate 16, height '5\' 6"'  (1.676 m),  weight 165 lb (74.8 kg), SpO2 99 %. Body mass index is 26.63 kg/m.  Vitals with BMI 11/11/2020 12/02/2019 12/02/2019  Height '5\' 6"'  - -  Weight 165 lbs - -  BMI 08.65 - -  Systolic 784 696 295  Diastolic 59 58 52  Pulse 59 60 58      Physical Exam Constitutional:      General: He is not in acute distress.    Appearance: He is well-developed.  HENT:     Head: Atraumatic.  Eyes:     Conjunctiva/sclera: Conjunctivae normal.  Neck:     Thyroid: No thyromegaly.     Vascular: No JVD.  Cardiovascular:     Rate and Rhythm: Normal rate and regular rhythm.     Pulses: Intact distal pulses.          Carotid pulses are on the right side with bruit and on the left side with bruit.    Heart sounds: S1 normal and S2 normal. Murmur heard.   Harsh crescendo midsystolic murmur is present with a grade of 3/6 at the upper right sternal border. No gallop.      Comments: Trace leg edema Pulmonary:     Effort: Pulmonary effort is normal.     Breath sounds: Normal breath sounds.  Abdominal:     General: Bowel sounds are normal.     Palpations: Abdomen is soft.  Musculoskeletal:        General: Normal range of motion.     Cervical back: Neck supple.  Skin:    General: Skin is warm and dry.  Neurological:     Mental Status: He is alert.    Radiology: No results found.  Laboratory examination:    CMP Latest Ref Rng & Units 12/01/2019 01/02/2019 07/07/2013  Glucose 70 - 99 mg/dL 114(H) 91 99  BUN 8 - 23 mg/dL 55(H) 42(H) 22  Creatinine 0.61 - 1.24 mg/dL 1.37(H) 1.33(H) 1.09  Sodium 135 - 145 mmol/L 135 141 141  Potassium 3.5 - 5.1 mmol/L 3.7 4.5 3.8  Chloride 98 - 111 mmol/L 110 108(H) 108  CO2 22 - 32 mmol/L 15(L) 20 22  Calcium 8.9 - 10.3 mg/dL 8.5(L) 9.1 8.8  Total Protein 6.0 - 8.3 g/dL - - -  Total Bilirubin 0.3 - 1.2 mg/dL - - -  Alkaline Phos 39 - 117 U/L - - -  AST 0 - 37 U/L - - -  ALT 0 - 53 U/L - - -   CBC Latest Ref Rng & Units 12/01/2019 07/07/2013 07/06/2013  WBC 4.0 - 10.5  K/uL 6.5 6.3 5.9  Hemoglobin 13.0 - 17.0 g/dL 12.4(L) 12.2(L) 12.4(L)  Hematocrit 39.0 - 52.0 % 37.5(L) 35.4(L) 36.0(L)  Platelets 150 - 400 K/uL 176 153 162   External labs:   Labs 05/02/2020:  Hb 12.3/HCT 35.9, platelets 131.  Normal indicis.  Serum glucose 93 mg, BUN 28, creatinine 1.26, EGFR 54 mL.  CMP normal.  TSH normal at  1.30.  Total cholesterol 108,, triglycerides 104, HDL 39, LDL 49.  Medications   Medications Discontinued During This Encounter  Medication Reason  . Melatonin 10 MG TABS Discontinued by provider  . amLODipine (NORVASC) 5 MG tablet Change in therapy   Current Meds  Medication Sig  . ARMOUR THYROID 15 MG tablet Take 45 mg by mouth daily.   . Ascorbic Acid (VITAMIN C) 1000 MG tablet Take 3,000 mg by mouth daily.   Marland Kitchen aspirin 81 MG tablet Take 1 tablet (81 mg total) by mouth daily. (Patient taking differently: Take 81-162 mg by mouth See admin instructions. Take 1 tablet every morning and take 2 tablets at night)  . atorvastatin (LIPITOR) 10 MG tablet TAKE 1 TABLET BY MOUTH EVERY DAY AT 6 PM  . Cholecalciferol (VITAMIN D3) 125 MCG (5000 UT) CAPS Take 5,000 Units by mouth daily.   . diphenhydramine-acetaminophen (TYLENOL PM) 25-500 MG TABS Take 1 tablet by mouth at bedtime.  . Multiple Vitamins-Minerals (MULTIVITAL PO) Take 1 capsule by mouth daily. One-A-Day  . valsartan-hydrochlorothiazide (DIOVAN-HCT) 320-12.5 MG tablet TAKE 1 TABLET BY MOUTH EVERY DAY  . vitamin E 1000 UNIT capsule Take 1,000 Units by mouth daily.  . Zinc 100 MG TABS Take 1 tablet by mouth daily.   Cardiac Studies:   Coronary angiogram 07/06/2013: Large circumflex, 99% stenosis. Successful stenting a 4.0 x 16 mm Rebel non-drug-eluting stent. Mild disease in other vessels.  Echocardiogram 11/06/2019: Left ventricle cavity is normal in size. Mild concentric hypertrophy of the left ventricle. Normal global wall motion. Normal LV systolic function with EF 56%. Doppler evidence of grade I  (impaired) diastolic dysfunction, normal LAP.  Trileaflet aortic valve with moderate calcification. Mild aortic stenosis. Aortic valve mean gradient of 12 mmHg, Vmax of 2.2 m/s. Calculated aortic valve area by continuity equation is 2.0 cm. Moderate (grade II) aortic regurgitation. Mild (Grade I) mitral regurgitation. Mild tricuspid regurgitation.  No evidence of pulmonary hypertension. No significant change compared to previous study on 06/16/2017.   Carotid artery duplex 10/23/2020: Stenosis in the right internal carotid artery (>=70%). The right PSV internal/common carotid artery ratio of 6.94 is consistent with a stenosis of >70%. Peak velocity 325/92 cm/s. Stenosis in the right external carotid artery (>50%). Stenosis in the left internal carotid artery (50-69%). Stenosis in the left external carotid artery (<50%). Antegrade right vertebral artery flow. Antegrade left vertebral artery flow. Follow up in six months is appropriate if clinically indicated. No significant change from 05/03/2020 and 11/06/2019.   EKG:   EKG 11/11/2020: Normal sinus rhythm at the rate of 60 bpm, left atrial enlargement, left axis deviation.  No evidence of ischemia, otherwise normal EKG. compared to 11/13/2019, first-degree AV block not present.   Assessment     ICD-10-CM   1. Coronary artery disease involving native coronary artery of native heart without angina pectoris  I25.10 EKG 12-Lead  2. Asymptomatic bilateral carotid artery stenosis  I65.23   3. Essential hypertension  I10   4. Hypercholesteremia  E78.00     Recommendations:   Matthew Brennan  is a 86 y.o. male  with  history of carcinoid syndrome, h/o partial small bowel resection in 2013, mild aortic stenosis, asymptomatic bilateral carotid stenosis, CAD s/p PCI to Cx with BMS in 2014, and chronic asymptomatic S. Bradycardia, right leg sciatica presents for 6 month f/u.   He is presently asymptomatic with regard to carotid stenosis  without recurrence of any TIA or strokelike symptoms.  From cardiac standpoint he  has not had any angina pectoris or symptoms suggestive of heart failure.  Back pain continues to be a major issue, he is now being scheduled for spinal stimulator implantation sometime in January 2022.  I reviewed his external labs, lipids under excellent control, blood pressure is also well controlled and carotid artery stenosis has remained stable.  Although 86 y.o. continues to remain very active and independent.  I will see him back in 6 months.    Adrian Prows, MD, [facility] 11/11/2020, 1:31 PM Office: [phone] Pager: [phone]

## 2020-11-15 DIAGNOSIS — R300 Dysuria: Secondary | ICD-10-CM | POA: Diagnosis not present

## 2020-11-15 DIAGNOSIS — N3001 Acute cystitis with hematuria: Secondary | ICD-10-CM | POA: Diagnosis not present

## 2020-11-18 DIAGNOSIS — R319 Hematuria, unspecified: Secondary | ICD-10-CM | POA: Diagnosis not present

## 2020-11-22 ENCOUNTER — Other Ambulatory Visit: Payer: Self-pay | Admitting: Cardiology

## 2020-11-22 DIAGNOSIS — E7800 Pure hypercholesterolemia, unspecified: Secondary | ICD-10-CM

## 2020-11-25 DIAGNOSIS — Z01818 Encounter for other preprocedural examination: Secondary | ICD-10-CM | POA: Diagnosis not present

## 2020-11-25 DIAGNOSIS — M419 Scoliosis, unspecified: Secondary | ICD-10-CM | POA: Diagnosis not present

## 2021-01-08 DIAGNOSIS — G894 Chronic pain syndrome: Secondary | ICD-10-CM | POA: Diagnosis not present

## 2021-01-30 DIAGNOSIS — G894 Chronic pain syndrome: Secondary | ICD-10-CM | POA: Diagnosis not present

## 2021-01-30 DIAGNOSIS — M419 Scoliosis, unspecified: Secondary | ICD-10-CM | POA: Diagnosis not present

## 2021-01-30 DIAGNOSIS — M47816 Spondylosis without myelopathy or radiculopathy, lumbar region: Secondary | ICD-10-CM | POA: Diagnosis not present

## 2021-01-30 DIAGNOSIS — M4316 Spondylolisthesis, lumbar region: Secondary | ICD-10-CM | POA: Diagnosis not present

## 2021-02-05 ENCOUNTER — Encounter: Payer: Self-pay | Admitting: Cardiology

## 2021-02-27 ENCOUNTER — Ambulatory Visit
Admission: RE | Admit: 2021-02-27 | Discharge: 2021-02-27 | Disposition: A | Discharge status: Home / Self Care | Payer: Medicare HMO | Source: Ambulatory Visit | Attending: Family Medicine | Admitting: Family Medicine

## 2021-02-27 ENCOUNTER — Other Ambulatory Visit: Payer: Self-pay | Admitting: Family Medicine

## 2021-02-27 DIAGNOSIS — Z01818 Encounter for other preprocedural examination: Secondary | ICD-10-CM

## 2021-04-18 DIAGNOSIS — M47816 Spondylosis without myelopathy or radiculopathy, lumbar region: Secondary | ICD-10-CM | POA: Diagnosis not present

## 2021-04-18 DIAGNOSIS — G894 Chronic pain syndrome: Secondary | ICD-10-CM | POA: Diagnosis not present

## 2021-04-18 DIAGNOSIS — M4316 Spondylolisthesis, lumbar region: Secondary | ICD-10-CM | POA: Diagnosis not present

## 2021-04-18 DIAGNOSIS — M4716 Other spondylosis with myelopathy, lumbar region: Secondary | ICD-10-CM | POA: Diagnosis not present

## 2021-04-25 DIAGNOSIS — G894 Chronic pain syndrome: Secondary | ICD-10-CM | POA: Diagnosis not present

## 2021-04-25 DIAGNOSIS — M4184 Other forms of scoliosis, thoracic region: Secondary | ICD-10-CM | POA: Diagnosis not present

## 2021-04-25 DIAGNOSIS — Z01812 Encounter for preprocedural laboratory examination: Secondary | ICD-10-CM | POA: Diagnosis not present

## 2021-04-28 ENCOUNTER — Other Ambulatory Visit: Payer: Self-pay

## 2021-04-30 ENCOUNTER — Other Ambulatory Visit: Payer: Self-pay

## 2021-05-12 ENCOUNTER — Ambulatory Visit: Payer: Medicare HMO | Admitting: Cardiology

## 2021-05-12 ENCOUNTER — Other Ambulatory Visit: Payer: Self-pay

## 2021-05-12 ENCOUNTER — Encounter: Payer: Self-pay | Admitting: Cardiology

## 2021-05-12 VITALS — BP 107/68 | HR 73 | Temp 98.6°F | Resp 17 | Ht 66.0 in | Wt 173.2 lb

## 2021-05-12 DIAGNOSIS — I2510 Atherosclerotic heart disease of native coronary artery without angina pectoris: Secondary | ICD-10-CM | POA: Diagnosis not present

## 2021-05-12 DIAGNOSIS — I6523 Occlusion and stenosis of bilateral carotid arteries: Secondary | ICD-10-CM

## 2021-05-12 DIAGNOSIS — Z01810 Encounter for preprocedural cardiovascular examination: Secondary | ICD-10-CM

## 2021-05-12 DIAGNOSIS — I10 Essential (primary) hypertension: Secondary | ICD-10-CM

## 2021-05-12 MED ORDER — NITROGLYCERIN 0.4 MG SL SUBL: 0.4000 mg | SUBLINGUAL_TABLET | Freq: Every day | SUBLINGUAL | 1 refills | Status: DC | PRN

## 2021-05-12 NOTE — Progress Notes (Signed)
Primary Physician/Referring:  Lujean Amel, MD  Patient ID: ULIS KAPS, male    DOB: 1932/01/05, 87 y.o.   MRN: [id number]  Chief Complaint  Patient presents with   carotid stenosis   Coronary Artery Disease   mild aortic stenosis    6 month    HPI: Matthew Brennan  is a 87 y.o. male with  history of carcinoid syndrome, h/o  partial small bowel resection in 2013, mild aortic stenosis, asymptomatic bilateral carotid stenosis, CAD s/p PCI to Cx with BMS in 2014, and chronic asymptomatic S. Bradycardia, right leg sciatica presents for 6 month f/u. He has now  With regard to coronary artery disease, he has remained asymptomatic without recurrence of angina pectoris since angioplasty in 2014. He also has asymptomatic bilateral carotid artery stenosis, right carotid artery stenosis is greater than 70% stenosed and is being closely monitored since 2018.    He has not had any visual disturbances or TIA-like symptoms. His main issue continues to be severe back pain and right leg sciatica. He is planning to have spine stimulator in July 2022.He and his wife have moved to [place], [place] to an independent living facility. He has had one angina episode during the move, but no further episodes.   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   Carcinoid tumor of intestine    Diverticulitis    Diverticulosis of colon without diverticulitis    Dysrhythmia    palpitations from MVP occ   ED (erectile dysfunction)    Enlarged prostate with lower urinary tract symptoms (LUTS)    Heart murmur    Hx SBO    Hypertension    Hypothyroidism    Mitral valve prolapse    last visit w/ cardiologist 1999 in [place]   Obesity    Parkinson disease ([place]) 07/2020   Pneumonia    as a child   Recurrent upper respiratory infection (URI)     Testosterone deficiency    Thyroid disease    hypothyroidism    Past Surgical History:  Procedure Laterality Date   BOWEL RESECTION  02/17/2012   Procedure: SMALL BOWEL RESECTION;  Surgeon: Adin Hector, MD;  Location: [place];  Service: General;;  small bowel resection and wedge resection small  bowel mass.   exploritory laperotmy with small bowel resection  02/17/2012   EYE SURGERY  2000,2003   cataract ext/iol bilateral   LAPAROTOMY  02/17/2012   Procedure: EXPLORATORY LAPAROTOMY;  Surgeon: Adin Hector, MD;  Location: [facility];  Service: General;  Laterality: N/A;   LEFT HEART CATHETERIZATION WITH CORONARY ANGIOGRAM N/A 07/06/2013   Procedure: LEFT HEART CATHETERIZATION WITH CORONARY ANGIOGRAM;  Surgeon: Laverda Page, MD;  Location: [facility] C[facility];  Service: Cardiovascular;  Laterality: N/A;   MOUTH SURGERY  [phone] 2004   NO PAST SURGERIES     no prior surgeries     Social History   Tobacco Use   Smoking status: Former    Packs/day: 4.00    Years: 5.00    Pack years: 20.00    Types: Cigarettes    Quit date: 01/06/1962    Years since quitting: 59.3   Smokeless tobacco: Never  Substance Use Topics   Alcohol use: No   Marital Status: Married  Review of Systems  HENT:  Nosebleeds: chronic and mild.   Cardiovascular:  Positive for leg swelling (stable). Negative for chest pain, dyspnea on exertion and syncope.  Musculoskeletal:  Positive for back pain (chronic, progressive and severe) and muscle cramps (righ leg sciatica). Negative for joint swelling.  Gastrointestinal:  Negative for hematochezia and melena.  Neurological:  Negative for headaches and light-headedness.  All other systems reviewed and are negative. Objective  Blood pressure 107/68, pulse 73, temperature 98.6 F (37 C), temperature source Temporal, resp. rate 17, height _0  (1.676 m), weight 173 lb 3.2 oz (78.6 kg), SpO2 96 %. Body mass index is 27.96 kg/m.  Vitals with BMI 05/12/2021 11/11/2020  12/02/2019  Height _1  _2  -  Weight 173 lbs 3 oz 165 lbs -  BMI 37.85 88.50 -  Systolic 277 412 878  Diastolic 68 59 58  Pulse 73 59 60      Physical Exam Constitutional:      General: He is not in acute distress.    Appearance: He is well-developed.  HENT:     Head: Atraumatic.  Neck:     Thyroid: No thyromegaly.     Vascular: Carotid bruit (bilateral) present. No JVD.  Cardiovascular:     Rate and Rhythm: Normal rate and regular rhythm.     Pulses: Normal pulses and intact distal pulses.     Heart sounds: S1 normal and S2 normal. Murmur heard.  Harsh crescendo midsystolic murmur is present with a grade of 3/6 at the upper right sternal border.    No gallop.  Pulmonary:     Effort: Pulmonary effort is normal.     Breath sounds: Normal breath sounds.  Abdominal:     General: Bowel sounds are normal.     Palpations: Abdomen is soft.  Musculoskeletal:        General: Swelling (bilateral 1-2+ pitting ankle edema) present. Normal range of motion.     Cervical back: Neck supple.  Skin:    General: Skin is warm and dry.  Neurological:     Mental Status: He is alert.   Radiology: No results found.  Laboratory examination:    CMP Latest Ref Rng & Units 12/01/2019 01/02/2019 07/07/2013  Glucose 70 - 99 mg/dL 114(H) 91 99  BUN 8 - 23 mg/dL 55(H) 42(H) 22  Creatinine 0.61 - 1.24 mg/dL 1.37(H) 1.33(H) 1.09  Sodium 135 - 145 mmol/L 135 141 141  Potassium 3.5 - 5.1 mmol/L 3.7 4.5 3.8  Chloride 98 - 111 mmol/L 110 108(H) 108  CO2 22 - 32 mmol/L 15(L) 20 22  Calcium 8.9 - 10.3 mg/dL 8.5(L) 9.1 8.8  Total Protein 6.0 - 8.3 g/dL - - -  Total Bilirubin 0.3 - 1.2 mg/dL - - -  Alkaline Phos 39 - 117 U/L - - -  AST 0 - 37 U/L - - -  ALT 0 - 53 U/L - - -   CBC Latest Ref Rng & Units 12/01/2019 07/07/2013 07/06/2013  WBC 4.0 - 10.5 K/uL 6.5 6.3 5.9  Hemoglobin 13.0 - 17.0 g/dL 12.4(L) 12.2(L) 12.4(L)  Hematocrit 39.0 - 52.0 % 37.5(L) 35.4(L) 36.0(L)  Platelets 150 - 400 K/uL 176 153  162   External labs:   Labs 05/02/2020:  Hb 12.3/HCT 35.9, platelets 131.  Normal indicis.  Serum glucose 93 mg, BUN 28, creatinine 1.26, EGFR 54 mL.  CMP normal.  TSH normal at 1.30.  Total cholesterol 108,, triglycerides 104, HDL 39, LDL 49.  Medications  Current Meds  Medication Sig   ARMOUR THYROID 15 MG tablet Take 45 mg by mouth daily.    Ascorbic Acid (VITAMIN C) 1000 MG tablet Take 3,000 mg by mouth daily.    aspirin 81 MG tablet Take 1 tablet (81 mg total) by mouth daily. (Patient taking differently: Take 81-162 mg by mouth See admin instructions. Take 1 tablet every morning and take 2 tablets at night)   atorvastatin (LIPITOR) 10 MG tablet TAKE 1 TABLET BY MOUTH EVERY DAY AT 6PM   Cholecalciferol (VITAMIN D3) 125 MCG (5000 UT) CAPS Take 5,000 Units by mouth daily.    diphenhydramine-acetaminophen (TYLENOL PM) 25-500 MG TABS Take 1 tablet by mouth at bedtime.   Multiple Vitamins-Minerals (MULTIVITAL PO) Take 1 capsule by mouth daily. One-A-Day   Turmeric 450 MG CAPS Take 1 capsule by mouth daily.   valsartan-hydrochlorothiazide (DIOVAN-HCT) 320-12.5 MG tablet TAKE 1 TABLET BY MOUTH EVERY DAY (Patient taking differently: 0.5 tablets.)   vitamin E 1000 UNIT capsule Take 1,000 Units by mouth daily.   Zinc 100 MG TABS Take 1 tablet by mouth daily.   [DISCONTINUED] nitroGLYCERIN (NITROSTAT) 0.4 MG SL tablet Place 1 tablet under the tongue daily as needed.   Cardiac Studies:   Coronary angiogram 07/06/2013: Large circumflex, 99% stenosis. Successful stenting a 4.0 x 16 mm Rebel non-drug-eluting stent. Mild disease in other vessels.  Echocardiogram 11/06/2019: Left ventricle cavity is normal in size. Mild concentric hypertrophy of the left ventricle. Normal global wall motion. Normal LV systolic function with EF 56%. Doppler evidence of grade I (impaired) diastolic dysfunction, normal LAP.  Trileaflet aortic valve with moderate calcification. Mild aortic stenosis. Aortic  valve mean gradient of 12 mmHg, Vmax of 2.2 m/s. Calculated aortic valve area by continuity equation is 2.0 cm. Moderate (grade II) aortic regurgitation. Mild (Grade I) mitral regurgitation. Mild tricuspid regurgitation.  No evidence of pulmonary hypertension. No significant change compared to previous study on 06/16/2017.   Carotid artery duplex 10/23/2020: Stenosis in the right internal carotid artery (>=70%). The right PSV internal/common carotid artery ratio of 6.94 is consistent with a stenosis of >70%. Peak velocity 325/92 cm/s. Stenosis in the right external carotid artery (>50%). Stenosis in the left internal carotid artery (50-69%). Stenosis in the left external carotid artery (<50%). Antegrade right vertebral artery flow. Antegrade left vertebral artery flow. Follow up in six months is appropriate if clinically indicated. No significant change from 05/03/2020 and 11/06/2019.   EKG:  EKG 05/12/2021: Normal sinus rhythm at rate of 69 bpm, leftward axis, frequent PVCs in trigeminal pattern.  No evidence of ischemia.  No significant change from prior EKG, PACs new compared to 11/11/2020.  Assessment     ICD-10-CM   1. Preop cardiovascular exam  Z01.810     2. Coronary artery disease involving native coronary artery of native heart without angina pectoris  I25.10 nitroGLYCERIN (NITROSTAT) 0.4 MG SL tablet    3. Asymptomatic bilateral carotid artery stenosis  I65.23     4. Essential hypertension  I10 EKG 12-Lead      Recommendations:   ADONAY SCHEIER  is a 87 y.o. male  with  history of carcinoid syndrome, h/o  partial small bowel resection in 2013, mild aortic stenosis, asymptomatic bilateral carotid stenosis, CAD s/p PCI to Cx with BMS in 2014, and chronic asymptomatic S. Bradycardia, right leg sciatica presents for 6 month f/u.   His activity has been severely limited due to pain in his right leg from sciatica and he has  been scheduled for a spinal stimulator by Dr. Rennis Harding on 06/19/2021.  I do not have any contraindication for patient to undergo the procedure as it is low risk.  However would certainly avoid hypotension in view of moderate to severe carotid artery disease. I have advised patient to hold Valsartan HCT 2 days prior to surgery.   He is presently asymptomatic with regard to carotid stenosis without recurrence of any TIA or strokelike symptoms.  From cardiac standpoint he has not had any angina pectoris or symptoms suggestive of heart failure.  I will set him up for carotid artery duplex surveillance prior to his surgery.  Certainly if carotid disease has progressed I will make changes to his preop evaluation personally.  Patient has now relocated to [place], [place] and I will refer him to be established with Dr. Christia Reading, Cardiology  [facility], [place], [place]).   I reviewed his external labs, lipids under excellent control, blood pressure is also well controlled and carotid artery stenosis has remained stable.  Although 87 y.o. continues to remain very active and independent. Wife present at bedside.   CC: Rennis Harding, MD; Christia Reading, DO (Cardiology - [place], [place], [place])    Adrian Prows, MD, [facility] 05/13/2021, 12:15 AM Office: [phone] Pager: [phone]

## 2021-05-13 ENCOUNTER — Encounter: Payer: Self-pay | Admitting: Cardiology

## 2021-05-20 DIAGNOSIS — Z87891 Personal history of nicotine dependence: Secondary | ICD-10-CM | POA: Diagnosis not present

## 2021-05-20 DIAGNOSIS — Z79899 Other long term (current) drug therapy: Secondary | ICD-10-CM | POA: Diagnosis not present

## 2021-05-20 DIAGNOSIS — I2510 Atherosclerotic heart disease of native coronary artery without angina pectoris: Secondary | ICD-10-CM | POA: Diagnosis not present

## 2021-05-20 DIAGNOSIS — Z23 Encounter for immunization: Secondary | ICD-10-CM | POA: Diagnosis not present

## 2021-05-20 DIAGNOSIS — I10 Essential (primary) hypertension: Secondary | ICD-10-CM | POA: Diagnosis not present

## 2021-05-20 DIAGNOSIS — E039 Hypothyroidism, unspecified: Secondary | ICD-10-CM | POA: Diagnosis not present

## 2021-05-20 DIAGNOSIS — R509 Fever, unspecified: Secondary | ICD-10-CM | POA: Diagnosis not present

## 2021-05-20 DIAGNOSIS — U071 COVID-19: Secondary | ICD-10-CM | POA: Diagnosis not present

## 2021-05-20 DIAGNOSIS — R059 Cough, unspecified: Secondary | ICD-10-CM | POA: Diagnosis not present

## 2021-05-20 DIAGNOSIS — R0602 Shortness of breath: Secondary | ICD-10-CM | POA: Diagnosis not present

## 2021-06-06 DIAGNOSIS — Z6825 Body mass index (BMI) 25.0-25.9, adult: Secondary | ICD-10-CM | POA: Diagnosis not present

## 2021-06-06 DIAGNOSIS — M4716 Other spondylosis with myelopathy, lumbar region: Secondary | ICD-10-CM | POA: Diagnosis not present

## 2021-06-06 DIAGNOSIS — G894 Chronic pain syndrome: Secondary | ICD-10-CM | POA: Diagnosis not present

## 2021-06-06 DIAGNOSIS — M5441 Lumbago with sciatica, right side: Secondary | ICD-10-CM | POA: Diagnosis not present

## 2021-06-19 DIAGNOSIS — G894 Chronic pain syndrome: Secondary | ICD-10-CM | POA: Diagnosis not present

## 2021-06-19 DIAGNOSIS — E063 Autoimmune thyroiditis: Secondary | ICD-10-CM | POA: Diagnosis not present

## 2021-06-19 DIAGNOSIS — M4726 Other spondylosis with radiculopathy, lumbar region: Secondary | ICD-10-CM | POA: Diagnosis not present

## 2021-06-19 DIAGNOSIS — M5441 Lumbago with sciatica, right side: Secondary | ICD-10-CM | POA: Diagnosis not present

## 2021-06-19 DIAGNOSIS — G20 Parkinson's disease: Secondary | ICD-10-CM | POA: Diagnosis not present

## 2021-06-19 DIAGNOSIS — I10 Essential (primary) hypertension: Secondary | ICD-10-CM | POA: Diagnosis not present

## 2021-06-19 DIAGNOSIS — Z9682 Presence of neurostimulator: Secondary | ICD-10-CM | POA: Diagnosis not present

## 2021-06-19 DIAGNOSIS — G629 Polyneuropathy, unspecified: Secondary | ICD-10-CM | POA: Diagnosis not present

## 2021-06-19 DIAGNOSIS — Z87891 Personal history of nicotine dependence: Secondary | ICD-10-CM | POA: Diagnosis not present

## 2021-06-19 DIAGNOSIS — M5431 Sciatica, right side: Secondary | ICD-10-CM | POA: Diagnosis not present

## 2021-06-19 DIAGNOSIS — Z79899 Other long term (current) drug therapy: Secondary | ICD-10-CM | POA: Diagnosis not present

## 2021-06-19 DIAGNOSIS — M4716 Other spondylosis with myelopathy, lumbar region: Secondary | ICD-10-CM | POA: Diagnosis not present

## 2021-06-21 DIAGNOSIS — G8929 Other chronic pain: Secondary | ICD-10-CM | POA: Diagnosis not present

## 2021-06-21 DIAGNOSIS — E039 Hypothyroidism, unspecified: Secondary | ICD-10-CM | POA: Diagnosis not present

## 2021-06-21 DIAGNOSIS — I10 Essential (primary) hypertension: Secondary | ICD-10-CM | POA: Diagnosis not present

## 2021-06-21 DIAGNOSIS — R531 Weakness: Secondary | ICD-10-CM | POA: Diagnosis not present

## 2021-06-21 DIAGNOSIS — I252 Old myocardial infarction: Secondary | ICD-10-CM | POA: Diagnosis not present

## 2021-06-21 DIAGNOSIS — M419 Scoliosis, unspecified: Secondary | ICD-10-CM | POA: Diagnosis not present

## 2021-06-21 DIAGNOSIS — R4182 Altered mental status, unspecified: Secondary | ICD-10-CM | POA: Diagnosis not present

## 2021-06-21 DIAGNOSIS — N289 Disorder of kidney and ureter, unspecified: Secondary | ICD-10-CM | POA: Diagnosis not present

## 2021-06-21 DIAGNOSIS — F22 Delusional disorders: Secondary | ICD-10-CM | POA: Diagnosis not present

## 2021-06-21 DIAGNOSIS — E860 Dehydration: Secondary | ICD-10-CM | POA: Diagnosis not present

## 2021-06-21 DIAGNOSIS — M549 Dorsalgia, unspecified: Secondary | ICD-10-CM | POA: Diagnosis not present

## 2021-06-21 DIAGNOSIS — R410 Disorientation, unspecified: Secondary | ICD-10-CM | POA: Diagnosis not present

## 2021-06-21 DIAGNOSIS — Z8679 Personal history of other diseases of the circulatory system: Secondary | ICD-10-CM | POA: Diagnosis not present

## 2021-06-21 DIAGNOSIS — E785 Hyperlipidemia, unspecified: Secondary | ICD-10-CM | POA: Diagnosis not present

## 2021-06-21 DIAGNOSIS — R443 Hallucinations, unspecified: Secondary | ICD-10-CM | POA: Diagnosis not present

## 2021-06-21 DIAGNOSIS — Z5329 Procedure and treatment not carried out because of patient's decision for other reasons: Secondary | ICD-10-CM | POA: Diagnosis not present

## 2021-06-21 DIAGNOSIS — R404 Transient alteration of awareness: Secondary | ICD-10-CM | POA: Diagnosis not present

## 2021-06-21 DIAGNOSIS — G9341 Metabolic encephalopathy: Secondary | ICD-10-CM | POA: Diagnosis not present

## 2021-06-21 DIAGNOSIS — I2510 Atherosclerotic heart disease of native coronary artery without angina pectoris: Secondary | ICD-10-CM | POA: Diagnosis not present

## 2021-06-25 DIAGNOSIS — M549 Dorsalgia, unspecified: Secondary | ICD-10-CM | POA: Diagnosis not present

## 2021-06-25 DIAGNOSIS — G20 Parkinson's disease: Secondary | ICD-10-CM | POA: Diagnosis not present

## 2021-06-25 DIAGNOSIS — N289 Disorder of kidney and ureter, unspecified: Secondary | ICD-10-CM | POA: Diagnosis not present

## 2021-06-25 DIAGNOSIS — G9341 Metabolic encephalopathy: Secondary | ICD-10-CM | POA: Diagnosis not present

## 2021-06-25 DIAGNOSIS — Z85030 Personal history of malignant carcinoid tumor of large intestine: Secondary | ICD-10-CM | POA: Diagnosis not present

## 2021-06-25 DIAGNOSIS — I10 Essential (primary) hypertension: Secondary | ICD-10-CM | POA: Diagnosis not present

## 2021-06-25 DIAGNOSIS — E782 Mixed hyperlipidemia: Secondary | ICD-10-CM | POA: Diagnosis not present

## 2021-06-25 DIAGNOSIS — E039 Hypothyroidism, unspecified: Secondary | ICD-10-CM | POA: Diagnosis not present

## 2021-07-16 DIAGNOSIS — I2510 Atherosclerotic heart disease of native coronary artery without angina pectoris: Secondary | ICD-10-CM | POA: Diagnosis not present

## 2021-07-16 DIAGNOSIS — N289 Disorder of kidney and ureter, unspecified: Secondary | ICD-10-CM | POA: Diagnosis not present

## 2021-07-16 DIAGNOSIS — E782 Mixed hyperlipidemia: Secondary | ICD-10-CM | POA: Diagnosis not present

## 2021-07-16 DIAGNOSIS — I10 Essential (primary) hypertension: Secondary | ICD-10-CM | POA: Diagnosis not present

## 2021-07-16 DIAGNOSIS — G20 Parkinson's disease: Secondary | ICD-10-CM | POA: Diagnosis not present

## 2021-07-16 DIAGNOSIS — E039 Hypothyroidism, unspecified: Secondary | ICD-10-CM | POA: Diagnosis not present

## 2021-07-17 ENCOUNTER — Other Ambulatory Visit: Payer: Self-pay | Admitting: Cardiology

## 2021-07-17 DIAGNOSIS — Z8249 Family history of ischemic heart disease and other diseases of the circulatory system: Secondary | ICD-10-CM | POA: Diagnosis not present

## 2021-07-17 DIAGNOSIS — E538 Deficiency of other specified B group vitamins: Secondary | ICD-10-CM | POA: Diagnosis not present

## 2021-07-17 DIAGNOSIS — E782 Mixed hyperlipidemia: Secondary | ICD-10-CM | POA: Diagnosis not present

## 2021-07-17 DIAGNOSIS — E559 Vitamin D deficiency, unspecified: Secondary | ICD-10-CM | POA: Diagnosis not present

## 2021-07-17 DIAGNOSIS — M4716 Other spondylosis with myelopathy, lumbar region: Secondary | ICD-10-CM | POA: Diagnosis not present

## 2021-07-17 DIAGNOSIS — R7303 Prediabetes: Secondary | ICD-10-CM | POA: Diagnosis not present

## 2021-07-17 DIAGNOSIS — R899 Unspecified abnormal finding in specimens from other organs, systems and tissues: Secondary | ICD-10-CM | POA: Diagnosis not present

## 2021-07-17 DIAGNOSIS — E7800 Pure hypercholesterolemia, unspecified: Secondary | ICD-10-CM

## 2021-07-17 DIAGNOSIS — R5383 Other fatigue: Secondary | ICD-10-CM | POA: Diagnosis not present

## 2021-08-14 DIAGNOSIS — E538 Deficiency of other specified B group vitamins: Secondary | ICD-10-CM | POA: Diagnosis not present

## 2021-08-14 DIAGNOSIS — E782 Mixed hyperlipidemia: Secondary | ICD-10-CM | POA: Diagnosis not present

## 2021-08-14 DIAGNOSIS — I2510 Atherosclerotic heart disease of native coronary artery without angina pectoris: Secondary | ICD-10-CM | POA: Diagnosis not present

## 2021-08-14 DIAGNOSIS — R748 Abnormal levels of other serum enzymes: Secondary | ICD-10-CM | POA: Diagnosis not present

## 2021-08-14 DIAGNOSIS — E039 Hypothyroidism, unspecified: Secondary | ICD-10-CM | POA: Diagnosis not present

## 2021-08-14 DIAGNOSIS — N289 Disorder of kidney and ureter, unspecified: Secondary | ICD-10-CM | POA: Diagnosis not present

## 2021-08-14 DIAGNOSIS — I10 Essential (primary) hypertension: Secondary | ICD-10-CM | POA: Diagnosis not present

## 2021-08-19 DIAGNOSIS — D649 Anemia, unspecified: Secondary | ICD-10-CM | POA: Diagnosis not present

## 2021-08-21 DIAGNOSIS — N281 Cyst of kidney, acquired: Secondary | ICD-10-CM | POA: Diagnosis not present

## 2021-08-21 DIAGNOSIS — R748 Abnormal levels of other serum enzymes: Secondary | ICD-10-CM | POA: Diagnosis not present

## 2021-08-29 DIAGNOSIS — I10 Essential (primary) hypertension: Secondary | ICD-10-CM | POA: Diagnosis not present

## 2021-08-29 DIAGNOSIS — R2689 Other abnormalities of gait and mobility: Secondary | ICD-10-CM | POA: Diagnosis not present

## 2021-08-29 DIAGNOSIS — M545 Low back pain, unspecified: Secondary | ICD-10-CM | POA: Diagnosis not present

## 2021-08-29 DIAGNOSIS — G8929 Other chronic pain: Secondary | ICD-10-CM | POA: Diagnosis not present

## 2021-08-29 DIAGNOSIS — M6281 Muscle weakness (generalized): Secondary | ICD-10-CM | POA: Diagnosis not present

## 2021-08-29 DIAGNOSIS — E538 Deficiency of other specified B group vitamins: Secondary | ICD-10-CM | POA: Diagnosis not present

## 2021-09-05 DIAGNOSIS — M545 Low back pain, unspecified: Secondary | ICD-10-CM | POA: Diagnosis not present

## 2021-09-05 DIAGNOSIS — G8929 Other chronic pain: Secondary | ICD-10-CM | POA: Diagnosis not present

## 2021-09-05 DIAGNOSIS — R2689 Other abnormalities of gait and mobility: Secondary | ICD-10-CM | POA: Diagnosis not present

## 2021-09-05 DIAGNOSIS — M6281 Muscle weakness (generalized): Secondary | ICD-10-CM | POA: Diagnosis not present

## 2021-09-08 DIAGNOSIS — M545 Low back pain, unspecified: Secondary | ICD-10-CM | POA: Diagnosis not present

## 2021-09-08 DIAGNOSIS — R2689 Other abnormalities of gait and mobility: Secondary | ICD-10-CM | POA: Diagnosis not present

## 2021-09-08 DIAGNOSIS — G8929 Other chronic pain: Secondary | ICD-10-CM | POA: Diagnosis not present

## 2021-09-08 DIAGNOSIS — M6281 Muscle weakness (generalized): Secondary | ICD-10-CM | POA: Diagnosis not present

## 2021-09-10 DIAGNOSIS — R2689 Other abnormalities of gait and mobility: Secondary | ICD-10-CM | POA: Diagnosis not present

## 2021-09-10 DIAGNOSIS — M545 Low back pain, unspecified: Secondary | ICD-10-CM | POA: Diagnosis not present

## 2021-09-10 DIAGNOSIS — G8929 Other chronic pain: Secondary | ICD-10-CM | POA: Diagnosis not present

## 2021-09-10 DIAGNOSIS — M6281 Muscle weakness (generalized): Secondary | ICD-10-CM | POA: Diagnosis not present

## 2021-09-11 DIAGNOSIS — N289 Disorder of kidney and ureter, unspecified: Secondary | ICD-10-CM | POA: Diagnosis not present

## 2021-09-11 DIAGNOSIS — I10 Essential (primary) hypertension: Secondary | ICD-10-CM | POA: Diagnosis not present

## 2021-09-15 DIAGNOSIS — G8929 Other chronic pain: Secondary | ICD-10-CM | POA: Diagnosis not present

## 2021-09-15 DIAGNOSIS — R2689 Other abnormalities of gait and mobility: Secondary | ICD-10-CM | POA: Diagnosis not present

## 2021-09-15 DIAGNOSIS — M6281 Muscle weakness (generalized): Secondary | ICD-10-CM | POA: Diagnosis not present

## 2021-09-15 DIAGNOSIS — M545 Low back pain, unspecified: Secondary | ICD-10-CM | POA: Diagnosis not present

## 2021-09-17 DIAGNOSIS — M6281 Muscle weakness (generalized): Secondary | ICD-10-CM | POA: Diagnosis not present

## 2021-09-17 DIAGNOSIS — M545 Low back pain, unspecified: Secondary | ICD-10-CM | POA: Diagnosis not present

## 2021-09-17 DIAGNOSIS — G8929 Other chronic pain: Secondary | ICD-10-CM | POA: Diagnosis not present

## 2021-09-17 DIAGNOSIS — R2689 Other abnormalities of gait and mobility: Secondary | ICD-10-CM | POA: Diagnosis not present

## 2021-09-18 DIAGNOSIS — I10 Essential (primary) hypertension: Secondary | ICD-10-CM | POA: Diagnosis not present

## 2021-09-22 DIAGNOSIS — M6281 Muscle weakness (generalized): Secondary | ICD-10-CM | POA: Diagnosis not present

## 2021-09-22 DIAGNOSIS — G8929 Other chronic pain: Secondary | ICD-10-CM | POA: Diagnosis not present

## 2021-09-22 DIAGNOSIS — M545 Low back pain, unspecified: Secondary | ICD-10-CM | POA: Diagnosis not present

## 2021-09-22 DIAGNOSIS — R2689 Other abnormalities of gait and mobility: Secondary | ICD-10-CM | POA: Diagnosis not present

## 2021-09-22 DIAGNOSIS — I10 Essential (primary) hypertension: Secondary | ICD-10-CM | POA: Diagnosis not present

## 2021-09-24 DIAGNOSIS — M545 Low back pain, unspecified: Secondary | ICD-10-CM | POA: Diagnosis not present

## 2021-09-24 DIAGNOSIS — R2689 Other abnormalities of gait and mobility: Secondary | ICD-10-CM | POA: Diagnosis not present

## 2021-09-24 DIAGNOSIS — M6281 Muscle weakness (generalized): Secondary | ICD-10-CM | POA: Diagnosis not present

## 2021-09-24 DIAGNOSIS — G8929 Other chronic pain: Secondary | ICD-10-CM | POA: Diagnosis not present

## 2021-09-25 DIAGNOSIS — I10 Essential (primary) hypertension: Secondary | ICD-10-CM | POA: Diagnosis not present

## 2021-09-25 DIAGNOSIS — D649 Anemia, unspecified: Secondary | ICD-10-CM | POA: Diagnosis not present

## 2021-09-25 DIAGNOSIS — E538 Deficiency of other specified B group vitamins: Secondary | ICD-10-CM | POA: Diagnosis not present

## 2021-09-29 DIAGNOSIS — M545 Low back pain, unspecified: Secondary | ICD-10-CM | POA: Diagnosis not present

## 2021-09-29 DIAGNOSIS — M6281 Muscle weakness (generalized): Secondary | ICD-10-CM | POA: Diagnosis not present

## 2021-09-29 DIAGNOSIS — G8929 Other chronic pain: Secondary | ICD-10-CM | POA: Diagnosis not present

## 2021-09-29 DIAGNOSIS — R2689 Other abnormalities of gait and mobility: Secondary | ICD-10-CM | POA: Diagnosis not present

## 2021-09-30 DIAGNOSIS — Z23 Encounter for immunization: Secondary | ICD-10-CM | POA: Diagnosis not present

## 2021-10-01 DIAGNOSIS — G8929 Other chronic pain: Secondary | ICD-10-CM | POA: Diagnosis not present

## 2021-10-01 DIAGNOSIS — M6281 Muscle weakness (generalized): Secondary | ICD-10-CM | POA: Diagnosis not present

## 2021-10-01 DIAGNOSIS — M545 Low back pain, unspecified: Secondary | ICD-10-CM | POA: Diagnosis not present

## 2021-10-01 DIAGNOSIS — R2689 Other abnormalities of gait and mobility: Secondary | ICD-10-CM | POA: Diagnosis not present

## 2021-10-02 DIAGNOSIS — I10 Essential (primary) hypertension: Secondary | ICD-10-CM | POA: Diagnosis not present

## 2021-10-02 DIAGNOSIS — R251 Tremor, unspecified: Secondary | ICD-10-CM | POA: Diagnosis not present

## 2021-10-02 DIAGNOSIS — E039 Hypothyroidism, unspecified: Secondary | ICD-10-CM | POA: Diagnosis not present

## 2021-10-06 DIAGNOSIS — M6281 Muscle weakness (generalized): Secondary | ICD-10-CM | POA: Diagnosis not present

## 2021-10-06 DIAGNOSIS — G8929 Other chronic pain: Secondary | ICD-10-CM | POA: Diagnosis not present

## 2021-10-06 DIAGNOSIS — R2689 Other abnormalities of gait and mobility: Secondary | ICD-10-CM | POA: Diagnosis not present

## 2021-10-06 DIAGNOSIS — M545 Low back pain, unspecified: Secondary | ICD-10-CM | POA: Diagnosis not present

## 2021-10-07 DIAGNOSIS — R251 Tremor, unspecified: Secondary | ICD-10-CM | POA: Diagnosis not present

## 2021-10-07 DIAGNOSIS — I10 Essential (primary) hypertension: Secondary | ICD-10-CM | POA: Diagnosis not present

## 2021-10-08 DIAGNOSIS — R2689 Other abnormalities of gait and mobility: Secondary | ICD-10-CM | POA: Diagnosis not present

## 2021-10-08 DIAGNOSIS — M6281 Muscle weakness (generalized): Secondary | ICD-10-CM | POA: Diagnosis not present

## 2021-10-08 DIAGNOSIS — M545 Low back pain, unspecified: Secondary | ICD-10-CM | POA: Diagnosis not present

## 2021-10-08 DIAGNOSIS — G8929 Other chronic pain: Secondary | ICD-10-CM | POA: Diagnosis not present

## 2021-10-13 DIAGNOSIS — R2689 Other abnormalities of gait and mobility: Secondary | ICD-10-CM | POA: Diagnosis not present

## 2021-10-13 DIAGNOSIS — G8929 Other chronic pain: Secondary | ICD-10-CM | POA: Diagnosis not present

## 2021-10-13 DIAGNOSIS — M545 Low back pain, unspecified: Secondary | ICD-10-CM | POA: Diagnosis not present

## 2021-10-13 DIAGNOSIS — M6281 Muscle weakness (generalized): Secondary | ICD-10-CM | POA: Diagnosis not present

## 2021-10-14 DIAGNOSIS — I10 Essential (primary) hypertension: Secondary | ICD-10-CM | POA: Diagnosis not present

## 2021-10-14 DIAGNOSIS — R251 Tremor, unspecified: Secondary | ICD-10-CM | POA: Diagnosis not present

## 2021-10-14 DIAGNOSIS — E538 Deficiency of other specified B group vitamins: Secondary | ICD-10-CM | POA: Diagnosis not present

## 2021-10-19 DIAGNOSIS — I10 Essential (primary) hypertension: Secondary | ICD-10-CM | POA: Diagnosis not present

## 2021-10-20 DIAGNOSIS — M545 Low back pain, unspecified: Secondary | ICD-10-CM | POA: Diagnosis not present

## 2021-10-20 DIAGNOSIS — G8929 Other chronic pain: Secondary | ICD-10-CM | POA: Diagnosis not present

## 2021-10-20 DIAGNOSIS — R2689 Other abnormalities of gait and mobility: Secondary | ICD-10-CM | POA: Diagnosis not present

## 2021-10-20 DIAGNOSIS — M6281 Muscle weakness (generalized): Secondary | ICD-10-CM | POA: Diagnosis not present

## 2021-10-22 DIAGNOSIS — M545 Low back pain, unspecified: Secondary | ICD-10-CM | POA: Diagnosis not present

## 2021-10-22 DIAGNOSIS — R2689 Other abnormalities of gait and mobility: Secondary | ICD-10-CM | POA: Diagnosis not present

## 2021-10-22 DIAGNOSIS — M6281 Muscle weakness (generalized): Secondary | ICD-10-CM | POA: Diagnosis not present

## 2021-10-22 DIAGNOSIS — G8929 Other chronic pain: Secondary | ICD-10-CM | POA: Diagnosis not present

## 2021-10-22 DIAGNOSIS — I10 Essential (primary) hypertension: Secondary | ICD-10-CM | POA: Diagnosis not present

## 2022-01-27 ENCOUNTER — Encounter: Payer: Self-pay | Admitting: Internal Medicine

## 2022-04-04 IMAGING — MR MR LUMBAR SPINE W/O CM
4 of 5 series · 27 of 48 positions shown · non-contrast
Comparison: Lumbar MRI 08/27/2016. CT Abdomen and Pelvis
12/23/2011.

CLINICAL DATA: 88-year-old male with increased left side low back
pain for 1 month. No known injury.

EXAM:
MRI LUMBAR SPINE WITHOUT CONTRAST
TECHNIQUE: Multiplanar, multisequence MR imaging of the lumbar spine was
performed. No intravenous contrast was administered.

[Series 2: T2 · sagittal · 4.0mm · 1.09mm/px · 7 of 17 slices shown (1 of 2)]
[im 1/17]
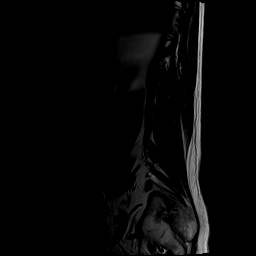
[im 3/17]
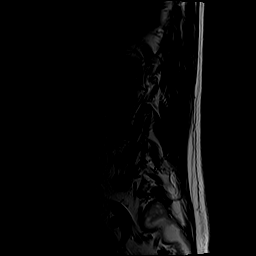
[im 6/17]
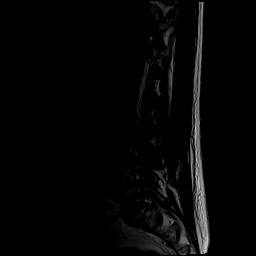
[im 9/17]
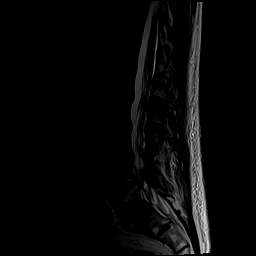
[im 11/17]
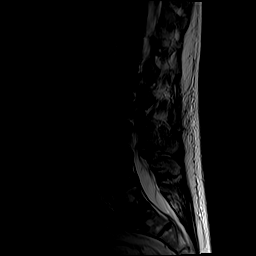
[im 14/17]
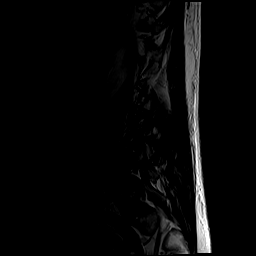
[im 17/17]
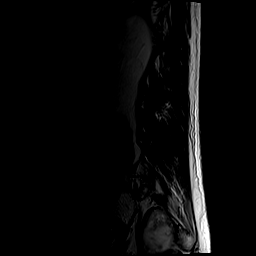

[Series 4: T1 · sagittal · 4.0mm · 1.09mm/px · 6 of 17 slices shown (1 of 2)]
[im 1/17]
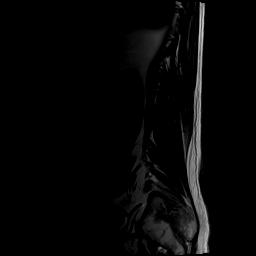
[im 4/17]
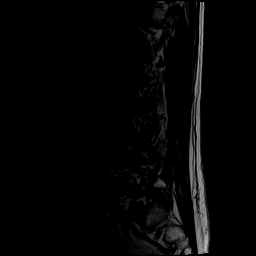
[im 7/17]
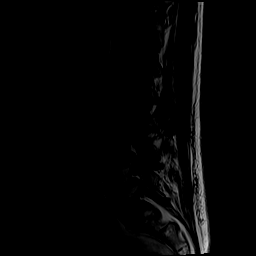
[im 10/17]
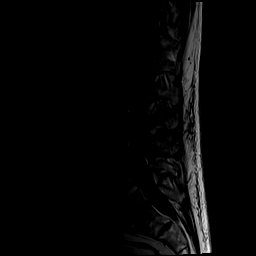
[im 13/17]
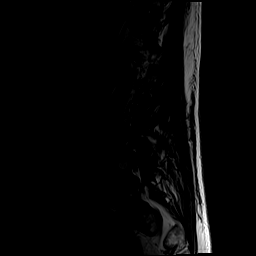
[im 17/17]
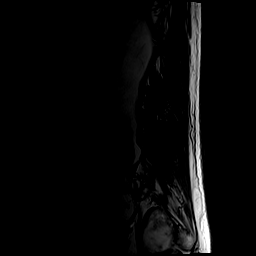

[Series 5: T2 · axial · 4.0mm · 0.39mm/px · z∈[-130,+74]mm · 8 of 38 slices shown (2 of 2)]
[im 1/38]
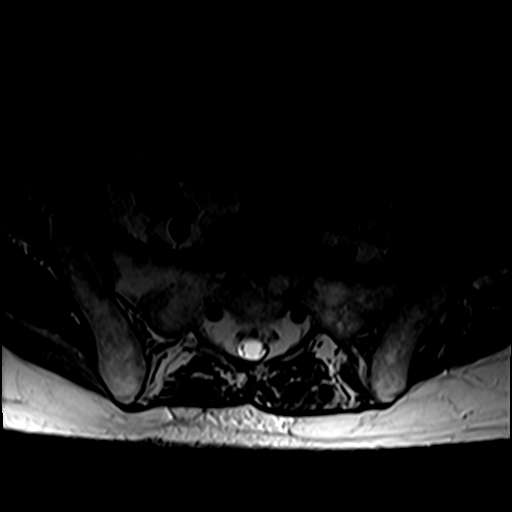
[im 6/38]
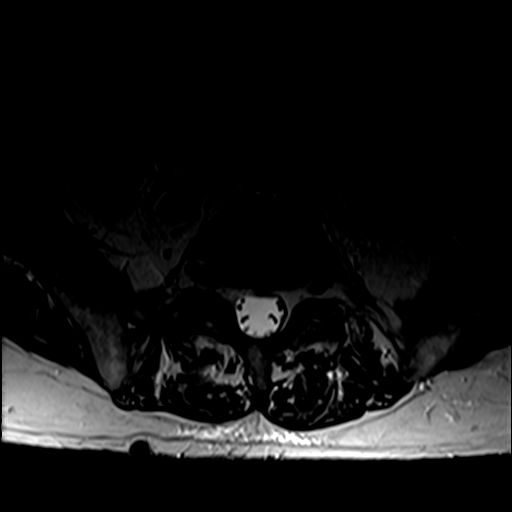
[im 12/38]
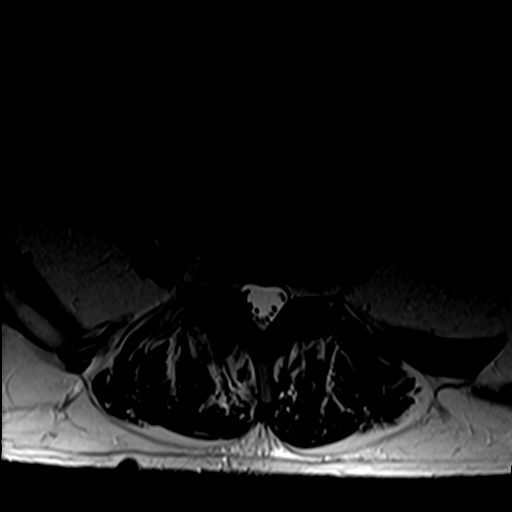
[im 18/38]
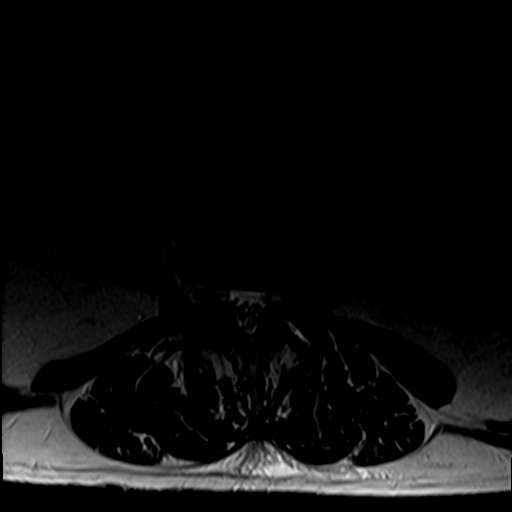
[im 20/38]
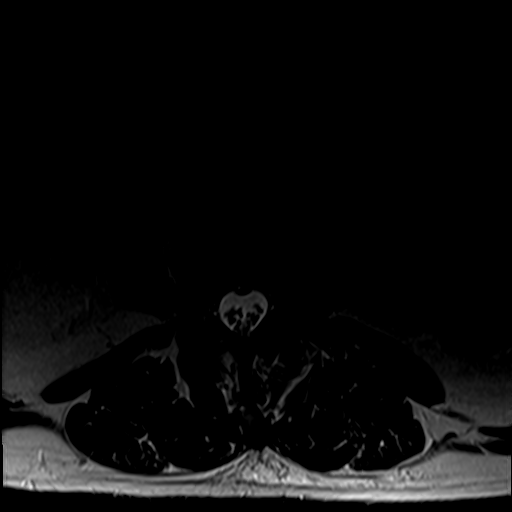
[im 26/38]
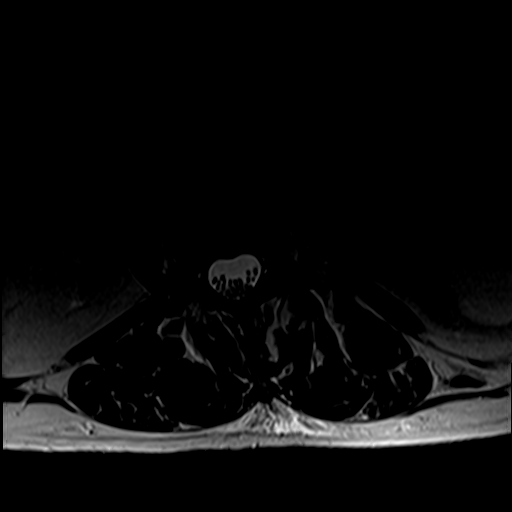
[im 32/38]
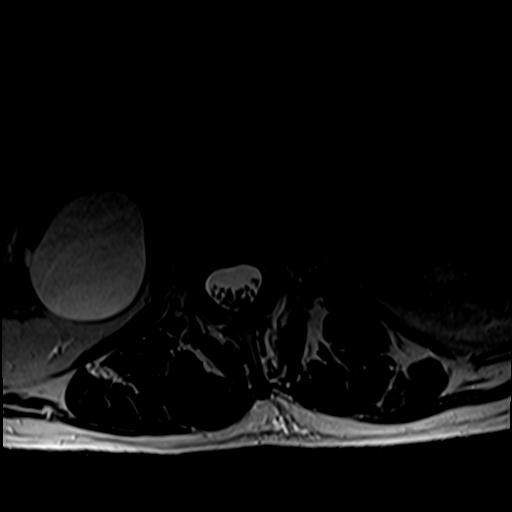
[im 38/38]
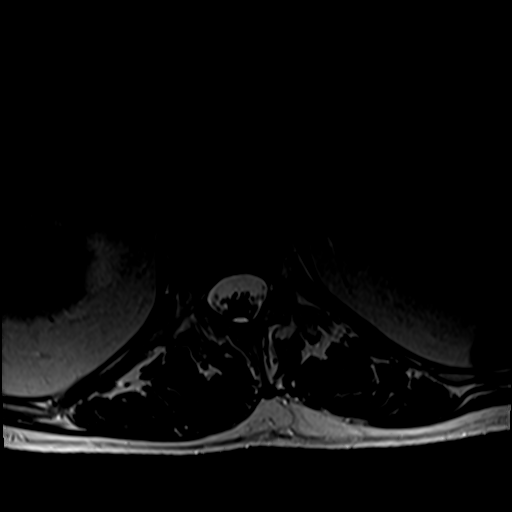

[Series 6: T1 · axial · 4.0mm · 0.39mm/px · z∈[-130,+46]mm · 6 of 38 slices shown (2 of 2)]
[im 1/38]
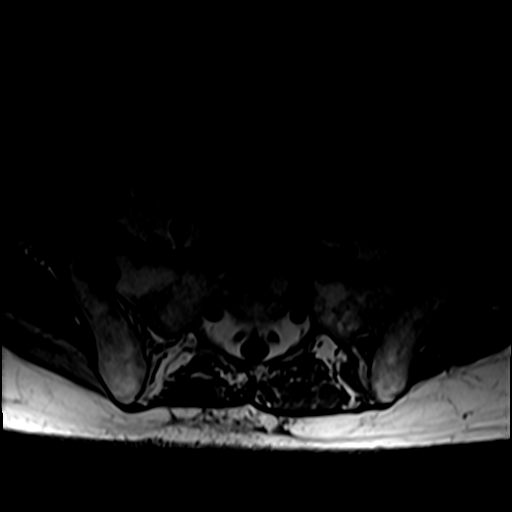
[im 6/38]
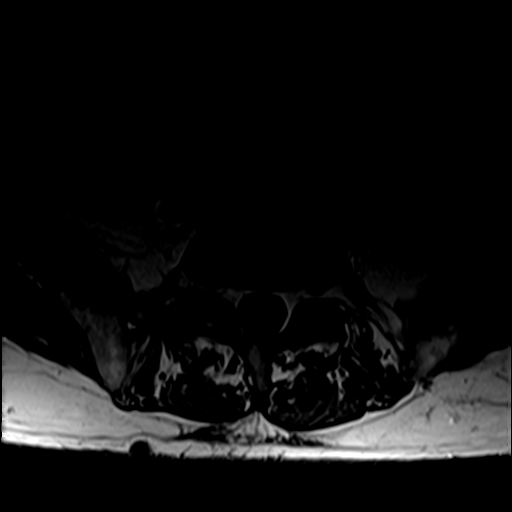
[im 12/38]
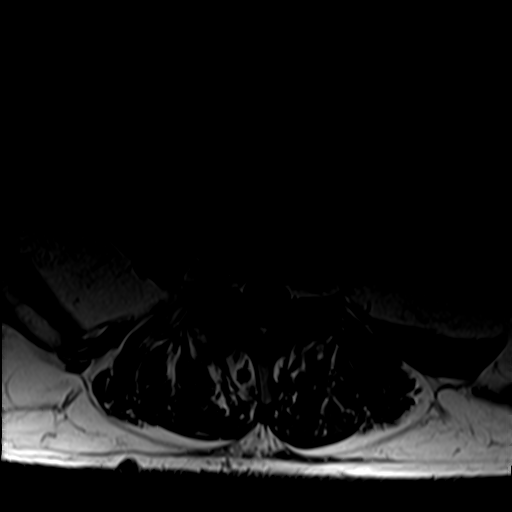
[im 18/38]
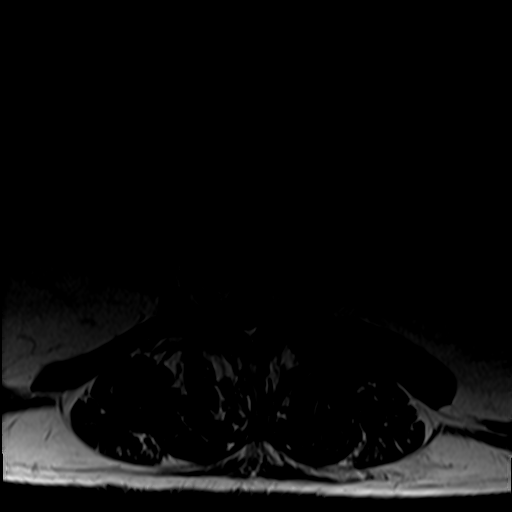
[im 20/38]
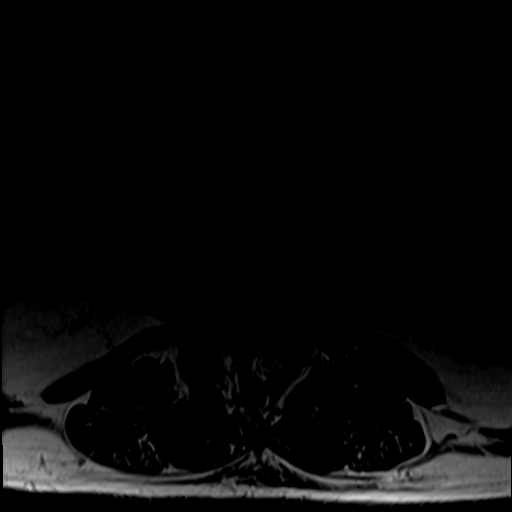
[im 32/38]
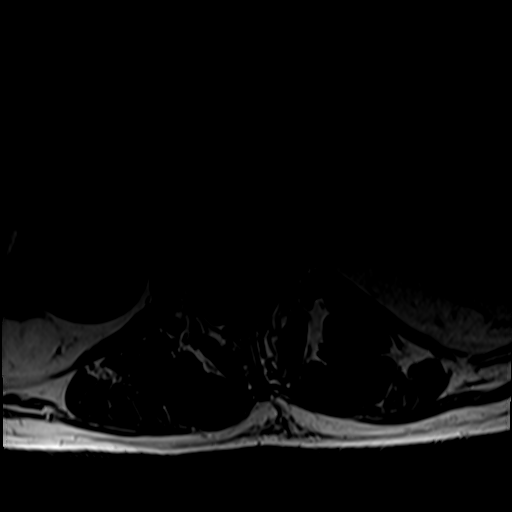

[27 of 48 positions shown; findings below may reference images not displayed]

FINDINGS: Segmentation: Normal on the comparison CT, the same numbering system
used on the 1979 MRI.

Alignment: Mostly levoconvex lumbar scoliosis appears increased
since 1979. Stable lumbar lordosis.

Vertebrae: Faint degenerative appearing marrow edema in the right
L3-L4 facets (series 3, image 7). See additional details of that
level below. Chronic endplate degeneration at L4-L5 eccentric to the
right, L to L3 eccentric to the left. Normal background bone marrow
signal. No other acute osseous abnormality identified. Intact
visible sacrum and SI joints.

Conus medullaris and cauda equina: Conus extends to the T12-L1
level. No lower spinal cord or conus signal abnormality.

Paraspinal and other soft tissues: Multiple chronic benign appearing
renal cysts. Negative visible other abdominal viscera. Negative
lumbar paraspinal soft tissues.

Disc levels:

Visible lower thoracic levels through T12-L1 appear negative aside
from facet hypertrophy.

L1-L2: Mild disc bulging, endplate spurring and facet hypertrophy.
Mild left L1 foraminal stenosis has not significantly changed.

L2-L3: Chronic disc space loss, left far lateral disc bulge and
endplate spurring. Mild facet and ligament flavum hypertrophy is
greater on the left. Stable mild left lateral recess stenosis (left
L3 nerve level) and left L2 foraminal stenosis.

L3-L4: Chronic circumferential disc bulge. Chronic but progressed
and now moderate bilateral facet and ligament flavum hypertrophy.
Chronic facet joint fluid. Increased mild spinal stenosis. No
convincing lateral recess stenosis. Moderate bilateral L3 foraminal
stenosis has not significantly changed.

L4-L5: Chronic disc space loss and mostly right far lateral disc
osteophyte complex. Mild posterior element hypertrophy. Stable mild
to moderate right L4 foraminal stenosis.

L5-S1: Moderate facet hypertrophy. Stable mild to moderate right L5
foraminal stenosis.
IMPRESSION: 1. Faint degenerative marrow edema in the right L3-L4 facets
compatible with acute exacerbation of the chronic facet joint
arthritis at that level. Increased mild multifactorial spinal
stenosis since the 1979 MRI. Stable moderate L3 foraminal stenosis.

2. Other lumbar levels are stable since 1979, with widespread facet
degeneration and disc bulging. No other lumbar spinal stenosis.
Moderate chronic right L4 and L5 neural foraminal stenosis.

## 2023-12-25 DEATH — deceased
